# Patient Record
Sex: Female | Born: 1950 | Race: White | Hispanic: No | Smoking: Former smoker
Health system: Southern US, Community
[De-identification: ages and names within clinical notes are randomized; demographics above are authoritative.]

## PROBLEM LIST (undated history)

## (undated) DIAGNOSIS — I10 Essential (primary) hypertension: Secondary | ICD-10-CM

## (undated) DIAGNOSIS — F329 Major depressive disorder, single episode, unspecified: Secondary | ICD-10-CM

## (undated) DIAGNOSIS — F32A Depression, unspecified: Secondary | ICD-10-CM

## (undated) DIAGNOSIS — E7800 Pure hypercholesterolemia, unspecified: Secondary | ICD-10-CM

## (undated) HISTORY — PX: LAMINECTOMY: SHX219

## (undated) HISTORY — PX: CHOLECYSTECTOMY: SHX55

## (undated) HISTORY — PX: EYE SURGERY: SHX253

## (undated) HISTORY — PX: TUBAL LIGATION: SHX77

## (undated) HISTORY — PX: BRAIN SURGERY: SHX531

## (undated) HISTORY — PX: CRANIOTOMY: SHX93

## (undated) HISTORY — PX: BREAST SURGERY: SHX581

---

## 2006-07-21 ENCOUNTER — Other Ambulatory Visit: Admission: RE | Admit: 2006-07-21 | Discharge: 2006-07-21 | Payer: Self-pay | Admitting: Family Medicine

## 2008-07-08 ENCOUNTER — Observation Stay (HOSPITAL_COMMUNITY): Admission: EM | Admit: 2008-07-08 | Discharge: 2008-07-09 | Payer: Self-pay | Admitting: Emergency Medicine

## 2008-09-24 ENCOUNTER — Emergency Department (HOSPITAL_COMMUNITY): Admission: EM | Admit: 2008-09-24 | Discharge: 2008-09-24 | Payer: Self-pay | Admitting: Emergency Medicine

## 2008-09-29 ENCOUNTER — Emergency Department: Payer: Self-pay | Admitting: Emergency Medicine

## 2009-02-24 ENCOUNTER — Other Ambulatory Visit: Admission: RE | Admit: 2009-02-24 | Discharge: 2009-02-24 | Payer: Self-pay | Admitting: Family Medicine

## 2009-04-14 ENCOUNTER — Ambulatory Visit: Payer: Self-pay | Admitting: Family Medicine

## 2009-08-28 ENCOUNTER — Ambulatory Visit: Payer: Self-pay | Admitting: Family Medicine

## 2009-09-18 ENCOUNTER — Ambulatory Visit: Payer: Self-pay | Admitting: Family Medicine

## 2009-10-12 ENCOUNTER — Ambulatory Visit: Payer: Self-pay | Admitting: Family Medicine

## 2009-11-12 ENCOUNTER — Ambulatory Visit: Payer: Self-pay | Admitting: Family Medicine

## 2010-02-16 ENCOUNTER — Other Ambulatory Visit: Admission: RE | Admit: 2010-02-16 | Discharge: 2010-02-16 | Payer: Self-pay | Admitting: Family Medicine

## 2010-02-16 ENCOUNTER — Ambulatory Visit: Payer: Self-pay | Admitting: Family Medicine

## 2010-09-07 LAB — CARDIAC PANEL(CRET KIN+CKTOT+MB+TROPI): CK, MB: 1.2 ng/mL (ref 0.3–4.0)

## 2010-09-07 LAB — TROPONIN I: Troponin I: <0.01 ng/mL (ref 0.00–0.06)

## 2010-09-07 LAB — URINE CULTURE
Colony Count: NO GROWTH
Culture: NO GROWTH

## 2010-09-07 LAB — POCT I-STAT, CHEM 8
HCT: 42 % (ref 36.0–46.0)
Hemoglobin: 14.3 g/dL (ref 12.0–15.0)
Potassium: 4.5 mEq/L (ref 3.5–5.1)
Sodium: 142 mEq/L (ref 135–145)
TCO2: 26 mmol/L (ref 0–100)

## 2010-09-07 LAB — URINALYSIS, ROUTINE W REFLEX MICROSCOPIC
Bilirubin Urine: NEGATIVE
Glucose, UA: NEGATIVE mg/dL
Hgb urine dipstick: NEGATIVE
Ketones, ur: NEGATIVE mg/dL
Protein, ur: NEGATIVE mg/dL

## 2010-09-07 LAB — CK TOTAL AND CKMB (NOT AT ARMC)
Relative Index: INVALID (ref 0.0–2.5)
Total CK: 79 U/L (ref 7–177)

## 2010-09-07 LAB — URINE MICROSCOPIC-ADD ON

## 2010-09-07 LAB — POCT CARDIAC MARKERS
CKMB, poc: <1 ng/mL — ABNORMAL LOW (ref 1.0–8.0)
Myoglobin, poc: 69.2 ng/mL (ref 12–200)

## 2010-10-05 NOTE — H&P (Signed)
NAME:  Toni Reyes, Toni Reyes NO.:  [id number]   MEDICAL RECORD NO.:  [id number]          PATIENT TYPE:  EMS   LOCATION:  MAJO                         FACILITY:  [facility]   PHYSICIAN:  Hollice Espy, M.D.DATE OF BIRTH:  [birth year]   DATE OF ADMISSION:  07/08/2008  DATE OF DISCHARGE:                              HISTORY & PHYSICAL   PRIMARY CARE PHYSICIAN:  Sigmund Hazel, M.D.   CONSULTANTS ON THE CASE:  Armanda Magic, M.D., Cardiology   CHIEF COMPLAINTS:  Chest pain.   HISTORY OF PRESENT ILLNESS:  The patient is a 60 year old white female  with past medical history of hyperlipidemia and depression who for the  last several weeks has been having intermittent chest pain described as  a sharp heaviness and midsternal with no associated radiation but some  associated shortness of breath.  She initially saw her PCP and was  scheduled to have an outpatient stress test done by [facility]  next week.  However, the interim, she started having more recurrence of  chest pain and came into the emergency room for further evaluation.  In  the emergency room she had an EKG done which actually showed just a  normal sinus rhythm and a borderline prolonged QT interval of 388 but  her chest x-ray and other lab work was unremarkable.  Given her  continued symptoms, it was felt best that she come in for further  evaluation and possible inpatient stress test.  The patient received  aspirin and nitroglycerin and her chest pain has resolved.  Currently,  she is doing well.  She denies any headaches, vision changes or  dysphagia.  No current chest pain.  No palpitations.  No shortness of  breath, wheeze, cough.  No abdominal pain.  No hematuria, dysuria,  constipation, diarrhea, focal extremity numbness, weakness or pain.   REVIEW OF SYSTEMS:  Otherwise negative.   PAST MEDICAL HISTORY:  Includes depression, anxiety and hyperlipidemia.   MEDICATIONS:  The patient is on Cymbalta 90,  Zocor 60 and aspirin 81.  She also takes p.r.n. Ativan 0.25 p.o. daily for anxiety.   ALLERGIES:  No known drug allergies.   SOCIAL HISTORY:  No tobacco, alcohol or drug use.   FAMILY HISTORY:  Noted for a dad with CAD.   PHYSICAL EXAMINATION:  VITALS:  On admission, temperature 96.9, heart  rate 100, now down to 80, blood pressure 136/85, now down to 120/77,  respirations 18, O2 saturations 99% on room air.  GENERAL:  She is alert  and oriented x3.  No apparent distress.  HEENT:  Normocephalic atraumatic.  Mucous membranes are moist.  She has  no carotid bruits.  HEART:  Regular rate and rhythm, S1, S2.  Soft, subtle 2/6 systolic  ejection murmur.  LUNGS:  Clear to auscultation bilaterally.  ABDOMEN:  Soft, nontender, nondistended.  Positive bowel sounds.  EXTREMITIES:  Show no clubbing, cyanosis or edema.   LAB WORK:  Sodium 142, potassium 4.5, chloride 108, BUN is 19,  creatinine 0.9, glucose 105.  UA notes large leukocyte esterase with 11  to 20 white  cells and many bacteria.  CPK MB is 69.2, MB less than 1,  troponin I less than 0.05.   ASSESSMENT/PLAN:  Chest pain, atypical.  We will plan to check 3 sets of  enzymes.  I have discussed with cardiology and we will see about the  possibility of getting inpatient stress test.  We will defer of course  to Dr. Mayford Knife.  1. Urinary tract infection.  Treat with p.o. Cipro.  2. Depression.  Continue Cymbalta.  3. Hyperlipidemia.  The patient has recently had lab work checked.  We      will discontinue Zocor.      Hollice Espy, M.D.  Electronically Signed     SKK/MEDQ  D:  07/08/2008  T:  07/08/2008  Job:  [id number]   cc:   Armanda Magic, M.D.  Sigmund Hazel, M.D.

## 2010-10-05 NOTE — Discharge Summary (Signed)
NAME:  Toni Reyes, Toni Reyes NO.:  [id number]   MEDICAL RECORD NO.:  [id number]          PATIENT TYPE:  INP   LOCATION:  3705                         FACILITY:  [facility]   PHYSICIAN:  Ramiro Harvest, MD    DATE OF BIRTH:  03/16/1951   DATE OF ADMISSION:  07/08/2008  DATE OF DISCHARGE:  07/09/2008                               DISCHARGE SUMMARY   The patient's primary care physician is Dr. Sigmund Hazel of [facility].   DISCHARGE DIAGNOSES:  1. Chest pain.  2. Urinary tract infection.  3. Depression/anxiety.  4. Hyperlipidemia.   DISCHARGE MEDICATIONS:  1. Ciprofloxacin 500 mg p.o. b.i.d. x2 days.  2. Protonix 40 mg p.o. daily  3. Cymbalta 90 mg p.o. daily  4. Zocor 40 mg p.o. daily.  5. Aspirin 81 mg p.o. daily.  6. Ativan 0.25 mg daily as needed.   DISPOSITION AND FOLLOW-UP:  The patient will be discharged home.  The  patient is to follow up with PCP in 1 week.  The patient will also need  to follow up with cardiology as previously scheduled.  If the patient  continues to have any further chest pain and is deemed per cardiology  not to be cardiac in nature, may consider a GI referral for further  workup of her GI sources.  The patient's PCP will need to follow up on  her urine cultures.   CONSULTATIONS DONE:  None.   PROCEDURES PERFORMED:  1. A Myoview stress test was performed on July 09, 2008, which      showed no evidence of myocardial reversibility, normal left      ventricular systolic function, left ventricular EF equals 84%.  2. A chest x-ray was done on July 08, 2008, that showed no active      cardiopulmonary disease.   ADMISSION HISTORY AND PHYSICAL:  Toni Reyes is a 61 year old white  female with history of hyperlipidemia, depression and anxiety who for  the last several weeks has been having intermittent chest pain described  as a sharp heaviness in the midsternal with no associated radiation but  with some associated shortness of  breath.  The patient initially saw her  PCP and was scheduled to have an outpatient stress test done by [facility]  cardiology next week.  However, in the interim, the patient was having  more recurrence of the chest pain and presented in the ED for further  evaluation.  In the ED, the patient had a EKG done which actually showed  normal sinus rhythm, borderline prolonged QT interval of 388.  Chest x-  ray another lab work was unremarkable.  Given her continued symptoms, it  was felt best that she come in for further evaluation and possible  inpatient stress test.  The patient received aspirin and nitro in the ED  when the chest pain had resolved and was doing fine.   The patient denied any headache.  No visual changes, no dysphagia, no  current chest pain, no palpitations, no shortness of breath, no wheeze,  no cough, no abdominal pain, no hematuria, no dysuria,  no constipation,  no diarrhea; no focal extremity weakness, numbness or pain.   PHYSICAL EXAMINATION:  Per admitting physician, temperature of 96.9,  pulse of 100 down to 80, blood pressure 136/85 down to 120/77,  respiratory rate 18, saturating 99% on room air.  GENERAL:  The patient was alert and oriented x3 in no apparent distress.  HEENT:  Normocephalic, atraumatic.  Pupils equal, round and reactive to  light.  Moist mucous membranes.  No carotid bruits.  RESPIRATORY:  Lungs were clear to auscultation bilaterally.  CARDIOVASCULAR:  Regular rate and rhythm.  S1-S2, soft, subtle, 2/6  systolic ejection murmur.  ABDOMEN:  Was soft, nontender, nondistended, positive bowel sounds.  EXTREMITIES:  No clubbing, cyanosis or edema.   ADMISSION LABORATORY DATA:  Sodium 142, potassium 4.5, chloride 108, BUN  19, creatinine 0.9, glucose of 105.  UA showed a large leukocyte  esterase with 11-20 white cells, many bacteria.  CPK-MB 69.2, MB less  than 1, troponin-I less than 0.05.   HOSPITAL COURSE:  1. Chest pain.  The patient was brought  in for chest pain rule out.      Cardiac enzymes were cycled which came back negative x3.  EKG as      stated above.  A Myoview stress test was ordered.  The patient had      a Myoview stress test done on July 09, 2008, which was      essentially negative.  The patient remained asymptomatic and chest      pain free throughout the rest of the hospitalization, and the      patient will be discharged home in stable condition.  It was      discussed with the patient's cardiologist, Dr. Armanda Magic, who      felt that as this was negative the patient was safe for discharge      and the patient to follow up at her regularly scheduled appointment      with her cardiologist.  The patient will be discharged in stable      and improved condition.  2. Urinary tract infection.  The patient was noted on urinalysis to      have a UTI.  A urine culture was ordered which was pending at the      time of discharge.  The patient was placed on oral ciprofloxacin.      The patient will be discharged home on 2 more days of ciprofloxacin      500 mg b.i.d.  The patient's urine cultures will need to be      followed up per PCP.   The rest of the patient's chronic medical issues were stable throughout  the hospitalization, and the patient will be discharged in stable  condition.   On day of discharge, vital signs - temperature 98.7, pulse of 70, blood  pressure 127/77, respiratory rate 18, saturating 99% on room air.   Toni Reyes will be discharged in stable and improved condition.  It  was a pleasure taking care of Toni Reyes.      Ramiro Harvest, MD  Electronically Signed     DT/MEDQ  D:  07/09/2008  T:  07/09/2008  Job:  [id number]   cc:   Sigmund Hazel, M.D.  Armanda Magic, M.D.

## 2011-09-20 ENCOUNTER — Ambulatory Visit: Payer: Self-pay | Admitting: Family

## 2011-10-20 ENCOUNTER — Ambulatory Visit: Payer: Self-pay | Admitting: Emergency Medicine

## 2011-10-20 LAB — HEPATIC FUNCTION PANEL A (ARMC)
Alkaline Phosphatase: 72 U/L (ref 50–136)
Bilirubin, Direct: <0.1 mg/dL (ref 0.00–0.20)
Bilirubin,Total: 0.3 mg/dL (ref 0.2–1.0)
SGPT (ALT): 40 U/L

## 2011-11-18 ENCOUNTER — Ambulatory Visit: Payer: Self-pay | Admitting: Emergency Medicine

## 2011-11-22 LAB — PATHOLOGY REPORT

## 2011-12-31 ENCOUNTER — Emergency Department (HOSPITAL_COMMUNITY)
Admission: EM | Admit: 2011-12-31 | Discharge: 2011-12-31 | Disposition: A | Discharge status: Home / Self Care | Payer: BLUE CROSS/BLUE SHIELD | Source: Home / Self Care | Attending: Family Medicine | Admitting: Family Medicine

## 2011-12-31 ENCOUNTER — Encounter (HOSPITAL_COMMUNITY): Payer: Self-pay | Admitting: *Deleted

## 2011-12-31 DIAGNOSIS — N390 Urinary tract infection, site not specified: Secondary | ICD-10-CM

## 2011-12-31 HISTORY — DX: Depression, unspecified: F32.A

## 2011-12-31 HISTORY — DX: Pure hypercholesterolemia, unspecified: E78.00

## 2011-12-31 HISTORY — DX: Essential (primary) hypertension: I10

## 2011-12-31 HISTORY — DX: Major depressive disorder, single episode, unspecified: F32.9

## 2011-12-31 LAB — POCT URINALYSIS DIP (DEVICE)
Bilirubin Urine: NEGATIVE
Nitrite: NEGATIVE
Protein, ur: >=300 mg/dL — AB
pH: 7 (ref 5.0–8.0)

## 2011-12-31 MED ORDER — CEPHALEXIN 500 MG PO CAPS: 500.0000 mg | ORAL_CAPSULE | Freq: Four times a day (QID) | ORAL | Status: AC

## 2011-12-31 NOTE — ED Notes (Signed)
Pt with onset of UTI symptoms x 3 days  Frequency/hematuria/dysuria/urgency - hx: UTI

## 2011-12-31 NOTE — ED Provider Notes (Signed)
History     CSN: [id number]  Arrival date & time 12/31/11  1130   None     Chief Complaint  Patient presents with  . Urinary Tract Infection  . Urinary Frequency  . Dysuria  . Hematuria    (Consider location/radiation/quality/duration/timing/severity/associated sxs/prior treatment) HPI Comments: Burning frequency for 3 days, blood today in urine. Has incontinence, occasionally gets utis if isn't able to maintain usual cleanliness. Last uti 1 year ago. No abx in last 6 months.   Patient is a 61 y.o. female presenting with urinary tract infection. The history is provided by the patient.  Urinary Tract Infection This is a new problem. Episode onset: 3 days ago. Episode frequency: with every urination. The problem has been gradually worsening. Pertinent negatives include no abdominal pain. Exacerbated by: urinating. Nothing relieves the symptoms. Treatments tried: otc pyridium.    Past Medical History  Diagnosis Date  . High cholesterol   . Depression   . Hypertension     Past Surgical History  Procedure Date  . Craniotomy   . Cesarean section   . Tubal ligation   . Laminectomy     History reviewed. No pertinent family history.  History  Substance Use Topics  . Smoking status: Never Smoker   . Smokeless tobacco: Not on file  . Alcohol Use: No    OB History    Grav Para Term Preterm Abortions TAB SAB Ect Mult Living                  Review of Systems  Constitutional: Negative for fever and chills.  Gastrointestinal: Negative for nausea, vomiting and abdominal pain.  Genitourinary: Positive for dysuria and frequency. Negative for flank pain, vaginal bleeding and vaginal discharge.    Allergies  Review of patient's allergies indicates no known allergies.  Home Medications   Current Outpatient Rx  Name Route Sig Dispense Refill  . FENOFIBRATE 150 MG PO CAPS Oral Take by mouth.    Marland Kitchen PRAVASTATIN SODIUM PO Oral Take by mouth.    . VENLAFAXINE HCL ER 150 MG  PO CP24 Oral Take 150 mg by mouth daily.    . CEPHALEXIN 500 MG PO CAPS Oral Take 1 capsule (500 mg total) by mouth 4 (four) times daily. 20 capsule 0    BP 140/67  Pulse 98  Temp 98.7 F (37.1 C) (Oral)  Resp 20  SpO2 100%  Physical Exam  Constitutional: She appears well-developed and well-nourished. No distress.  Cardiovascular: Normal rate and regular rhythm.   Pulmonary/Chest: Effort normal and breath sounds normal.  Abdominal: Soft. Normal appearance and bowel sounds are normal. She exhibits no distension. There is no tenderness. There is no rigidity, no rebound, no guarding and no CVA tenderness.    ED Course  Procedures (including critical care time)  Labs Reviewed  POCT URINALYSIS DIP (DEVICE) - Abnormal; Notable for the following:    Hgb urine dipstick LARGE (*)     Protein, ur >=300 (*)     Leukocytes, UA SMALL (*)  Biochemical Testing Only. Please order routine urinalysis from main lab if confirmatory testing is needed.   All other components within normal limits   No results found.   1. UTI (lower urinary tract infection)       MDM          Cathlyn Parsons, NP 12/31/11 1337

## 2012-01-01 NOTE — ED Provider Notes (Signed)
Medical screening examination/treatment/procedure(s) were performed by non-physician practitioner and as supervising physician I was immediately available for consultation/collaboration.   MORENO-COLL,Jermine Bibbee; MD   Kaylie Ritter Moreno-Coll, MD 01/01/12 0744 

## 2012-02-09 DIAGNOSIS — R32 Unspecified urinary incontinence: Secondary | ICD-10-CM | POA: Insufficient documentation

## 2012-10-13 IMAGING — US ABDOMEN ULTRASOUND
1 series · 17 of 25 positions shown · non-contrast
Comparison: none

REASON FOR EXAM: RUQ pain
COMMENTS:

[Series 1: abdomen ultrasound · 17 of 76 slices shown]
[im 1/76]
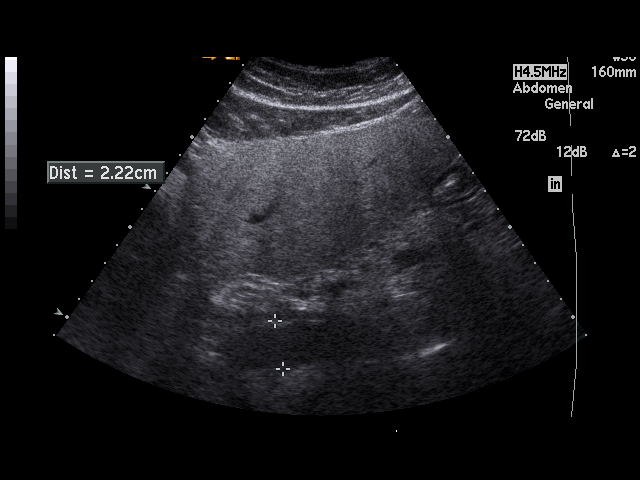
[im 7/76]
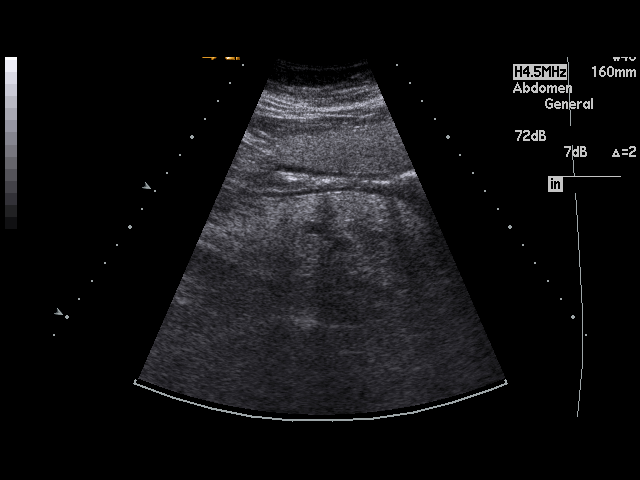
[im 10/76]
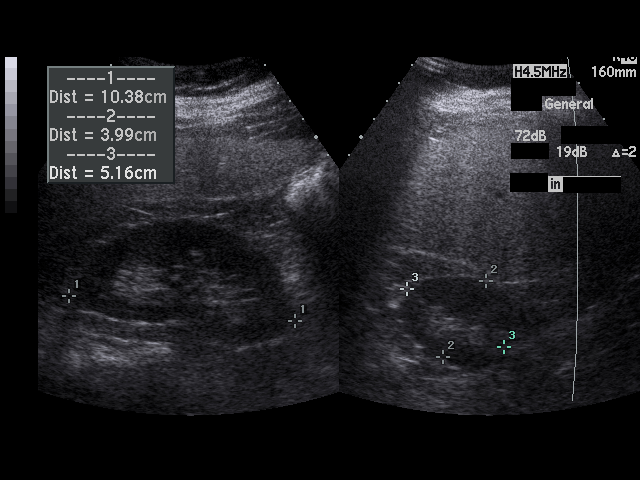
[im 16/76]
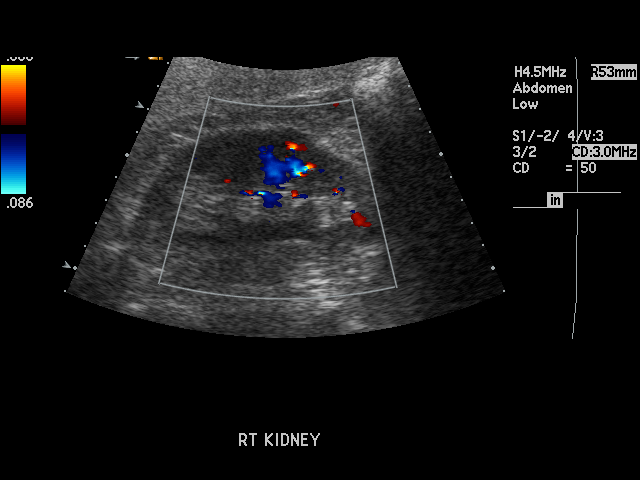
[im 19/76]
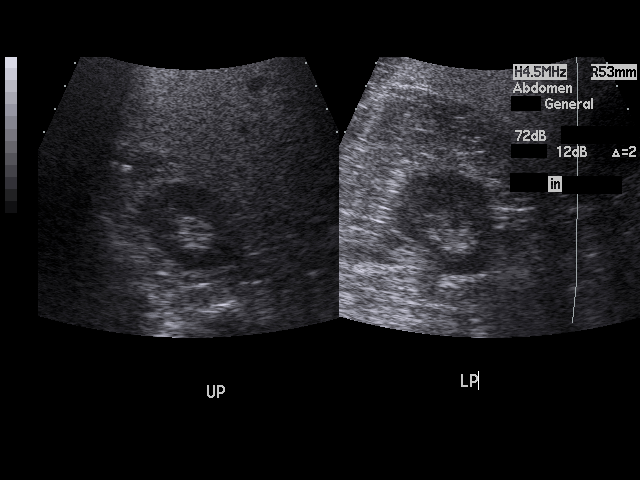
[im 26/76]
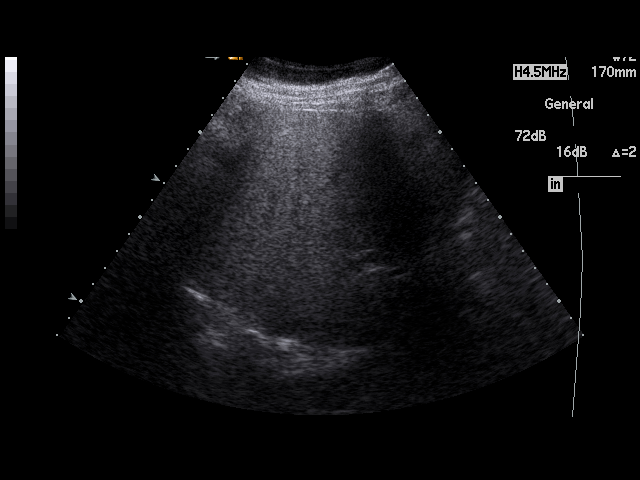
[im 29/76]
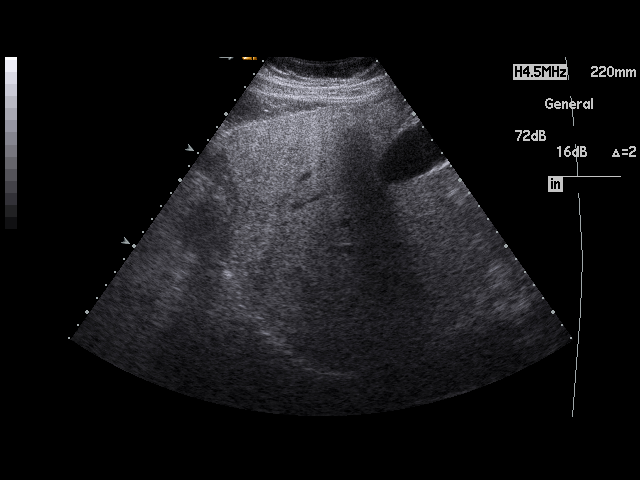
[im 35/76]
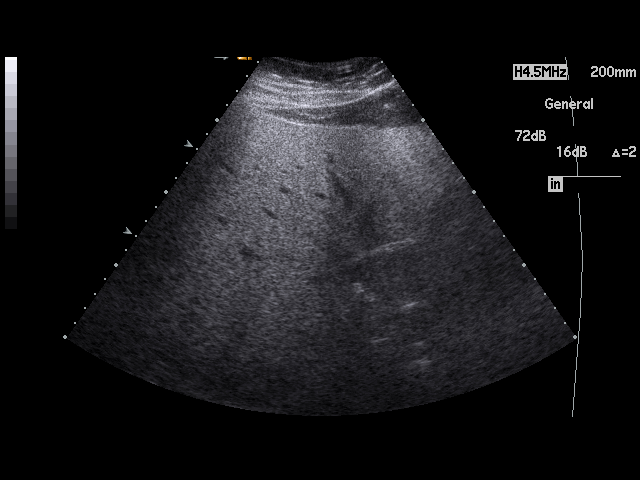
[im 38/76]
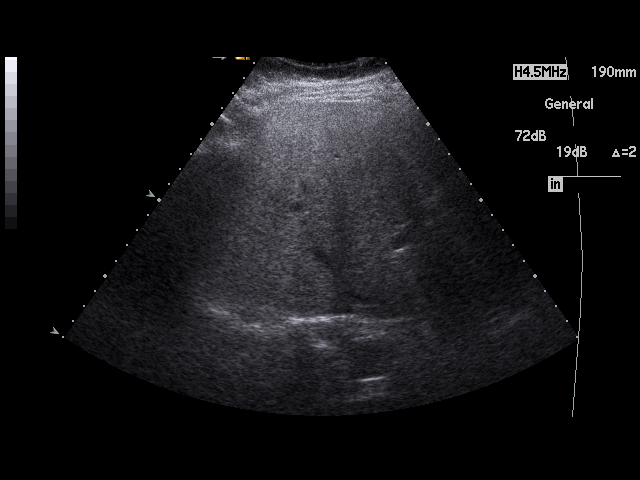
[im 41/76]
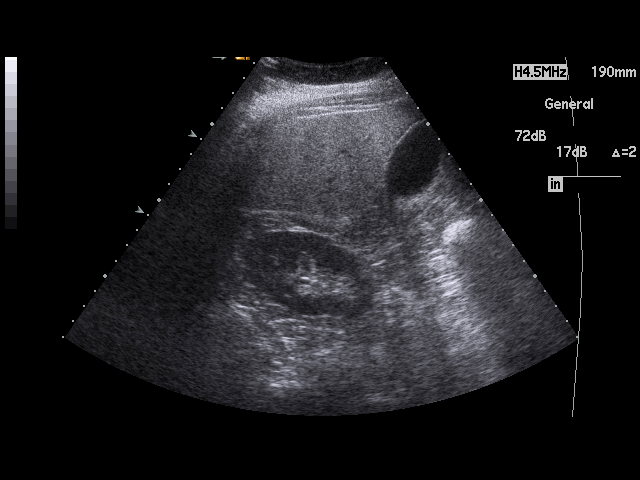
[im 47/76]
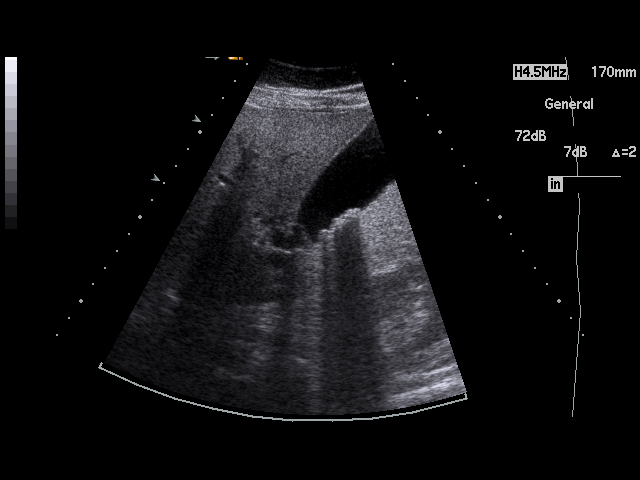
[im 51/76]
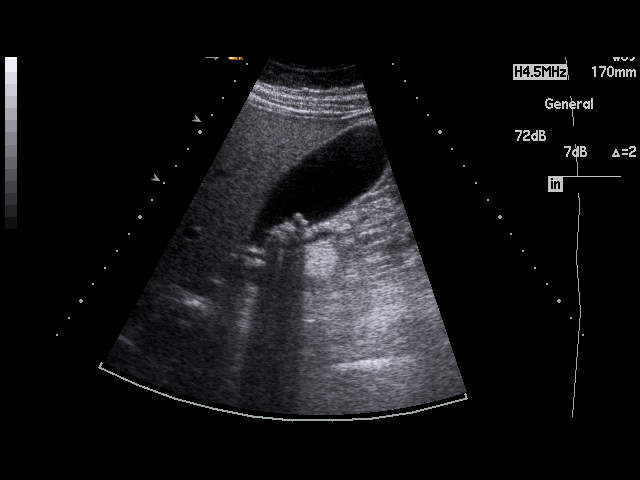
[im 57/76]
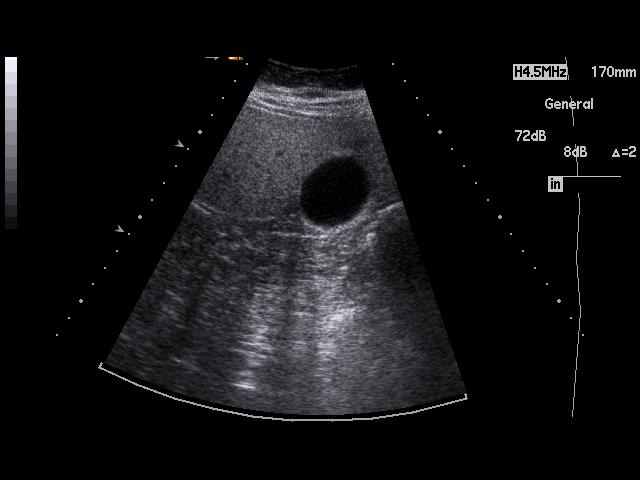
[im 60/76]
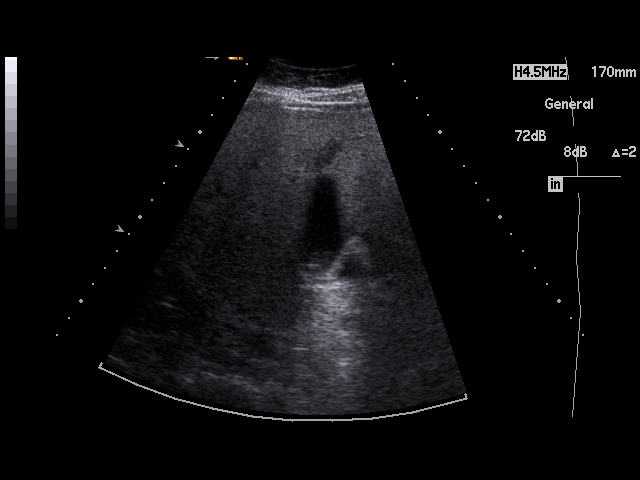
[im 66/76]
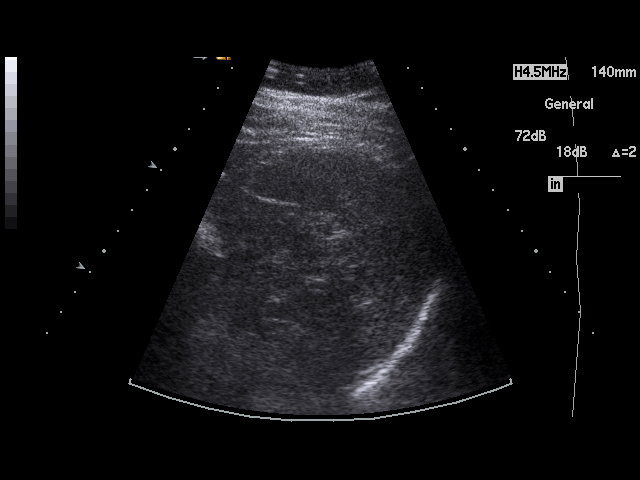
[im 69/76]
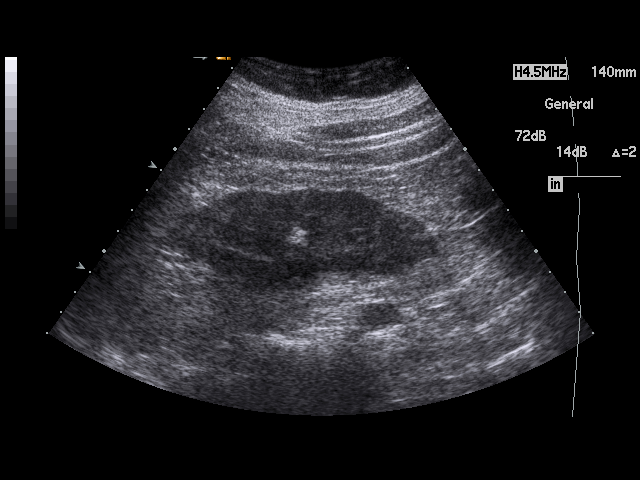
[im 76/76]
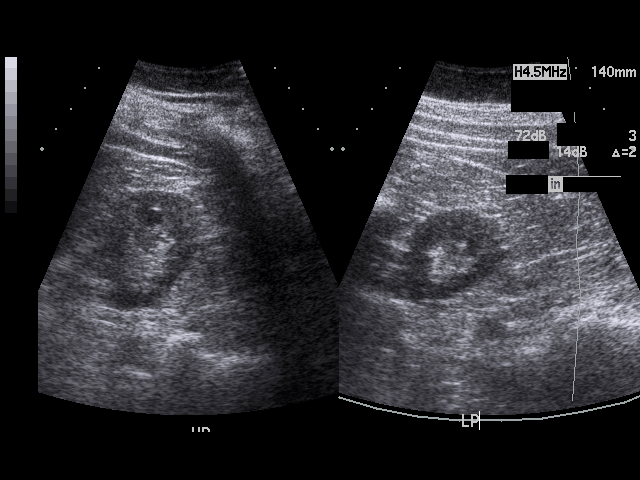

[17 of 25 positions shown; findings below may reference images not displayed]

PROCEDURE:     US  - US ABDOMEN GENERAL SURVEY  - September 20, 2011  [DATE]

RESULT:     The liver is hyperechogenic, suspicious for fatty infiltration.
No focal hepatic mass is seen. Spleen size is normal. The pancreas,
abdominal aorta and inferior vena cava show no significant abnormalities.
There are multiple mobile echodensities within the gallbladder compatible
with gallstones. There is no thickening of the gallbladder wall. No
pericholecystic fluid is seen. There is no thickening of the gallbladder
wall which measures 2.4 mm at maximum thickness. The common bile duct
measures variably between 4.7 cm and 7.3 cm. There are no dilated
intrahepatic bile ducts identified to suggest common duct obstruction. The
kidneys show no hydronephrosis. Sagittally, the right kidney measures
cm and left measures 11.93 cm. No ascites is seen.
IMPRESSION: 1. Probable fatty infiltration of the liver.
2. Cholelithiasis.

## 2012-11-20 ENCOUNTER — Ambulatory Visit (INDEPENDENT_AMBULATORY_CARE_PROVIDER_SITE_OTHER): Payer: BC Managed Care – PPO | Admitting: Physician Assistant

## 2012-11-20 ENCOUNTER — Telehealth: Payer: Self-pay | Admitting: Radiology

## 2012-11-20 VITALS — BP 132/74 | HR 81 | Temp 98.0°F | Resp 16 | Ht 61.0 in | Wt 168.0 lb

## 2012-11-20 DIAGNOSIS — F32A Depression, unspecified: Secondary | ICD-10-CM

## 2012-11-20 DIAGNOSIS — F329 Major depressive disorder, single episode, unspecified: Secondary | ICD-10-CM

## 2012-11-20 DIAGNOSIS — F3289 Other specified depressive episodes: Secondary | ICD-10-CM

## 2012-11-20 MED ORDER — BUPROPION HCL ER (SR) 150 MG PO TB12: 150.0000 mg | ORAL_TABLET | Freq: Two times a day (BID) | ORAL | Status: DC

## 2012-11-20 MED ORDER — BUPROPION HCL ER (XL) 150 MG PO TB24: 150.0000 mg | ORAL_TABLET | Freq: Every day | ORAL | Status: DC

## 2012-11-20 MED ORDER — VENLAFAXINE HCL ER 150 MG PO CP24: 150.0000 mg | ORAL_CAPSULE | Freq: Every day | ORAL | Status: DC

## 2012-11-20 NOTE — Progress Notes (Signed)
  Subjective:    Patient ID: Toni Reyes, female    DOB: 1950-09-08, 62 y.o.   MRN: [id number]  HPI 62 year old female presents for refills of Effexor-XR and Wellbutrin-SR.  She takes both daily and has been stable on these medications for years.  Has history of treatment by a psychiatrist but has only seen a psychologist for the past few years.  Is interested in being referred to psychiatry again. She does feel like she has fairly good control of her depression and stress with her medications, she does think that it could be better.  Still feels angry towards people and tends to withdraw.  Denies SI/HI. She has been out of her medications for 6 days and definitely feels "more stressed, angry, and sad."    PMHx of hypertension and hyperlipidemia. Continues to take lisinopril daily which has done well. Has not been taking fenofibrate or pravastatin due to cost.   Patient is otherwise doing well.     Review of Systems  Constitutional: Negative for fever and chills.  Gastrointestinal: Negative for nausea and vomiting.  Psychiatric/Behavioral: Negative for suicidal ideas, behavioral problems and agitation.       Objective:   Physical Exam  Constitutional: She is oriented to person, place, and time. She appears well-developed and well-nourished.  HENT:  Head: Normocephalic and atraumatic.  Right Ear: External ear normal.  Left Ear: External ear normal.  Eyes: Conjunctivae are normal.  Neck: Normal range of motion.  Cardiovascular: Normal rate.   Pulmonary/Chest: Effort normal.  Neurological: She is alert and oriented to person, place, and time.  Psychiatric: She has a normal mood and affect. Her behavior is normal. Judgment and thought content normal. She expresses no homicidal and no suicidal ideation.  Tearful during exam          Assessment & Plan:  Depression - Plan: buPROPion (WELLBUTRIN SR) 150 MG 12 hr tablet, venlafaxine XR (EFFEXOR-XR) 150 MG 24 hr capsule, Ambulatory  referral to Psychiatry Refill Effexor-XR 150 mg daily and Wellbutrin 150 mg-SR daily Referral placed for psychiatry Recommend schedule an appointment at 104 for CPE - prefers MD.  Follow up here as needed.

## 2012-11-20 NOTE — Telephone Encounter (Signed)
Sent to pharmacy 

## 2012-11-20 NOTE — Telephone Encounter (Signed)
Please verify which Wellbutrin patient should be on pharmacy indicates she was previously on XL please advise. Need to resubmit.

## 2013-06-25 ENCOUNTER — Ambulatory Visit: Payer: Self-pay | Admitting: Family Medicine

## 2013-06-25 VITALS — BP 175/92 | HR 83 | Temp 97.8°F | Resp 18 | Wt 175.0 lb

## 2013-06-25 DIAGNOSIS — J019 Acute sinusitis, unspecified: Secondary | ICD-10-CM

## 2013-06-25 DIAGNOSIS — F329 Major depressive disorder, single episode, unspecified: Secondary | ICD-10-CM

## 2013-06-25 DIAGNOSIS — J209 Acute bronchitis, unspecified: Secondary | ICD-10-CM

## 2013-06-25 DIAGNOSIS — F3289 Other specified depressive episodes: Secondary | ICD-10-CM

## 2013-06-25 DIAGNOSIS — F32A Depression, unspecified: Secondary | ICD-10-CM

## 2013-06-25 MED ORDER — AMOXICILLIN 875 MG PO TABS: 875.0000 mg | ORAL_TABLET | Freq: Two times a day (BID) | ORAL | Status: DC

## 2013-06-25 MED ORDER — BUPROPION HCL ER (XL) 150 MG PO TB24: 150.0000 mg | ORAL_TABLET | Freq: Every day | ORAL | Status: DC

## 2013-06-25 MED ORDER — VENLAFAXINE HCL ER 150 MG PO CP24: 150.0000 mg | ORAL_CAPSULE | Freq: Every day | ORAL | Status: DC

## 2013-06-25 NOTE — Patient Instructions (Signed)
Drink plenty of fluids. Actually that is your best expectorant to keep himself well hydrated.  Take Mucinex or Robitussin, plain for the mucus thinning affect.  Amoxicillin 875 one twice daily  Return if worse or not improving, at which time we would need to do further studies.

## 2013-06-25 NOTE — Progress Notes (Signed)
Subjective: Patient has had a respiratory tract infection a couple weeks ago and her sinuses been staying congested. She has developed a cough also with it, and continues to cough and is short of breath on exertion. No fevers. Not bringing up much mucus. This doesn't feel like he has any energy.  Objective: Pleasant lady in no major distress. TMs normal. Right maxillary sinuses tender, other. Her throat was clear. Neck supple without nodes. Chest is clear to auscultation. Heart regular without murmurs.  Assessment: Sinusitis/bronchitis  Plan: Patient does not have insurance. Went ahead and treat with amoxicillin 875 twice a day and OTC mucolytic. If she gets worse or is not improving we'll then need to do further studies, but decided not to at this time.

## 2013-12-10 DIAGNOSIS — F329 Major depressive disorder, single episode, unspecified: Secondary | ICD-10-CM | POA: Diagnosis not present

## 2013-12-10 DIAGNOSIS — I10 Essential (primary) hypertension: Secondary | ICD-10-CM | POA: Diagnosis not present

## 2013-12-10 DIAGNOSIS — M899 Disorder of bone, unspecified: Secondary | ICD-10-CM | POA: Diagnosis not present

## 2013-12-10 DIAGNOSIS — Z0000 Encounter for general adult medical examination without abnormal findings: Secondary | ICD-10-CM | POA: Diagnosis not present

## 2013-12-10 DIAGNOSIS — R635 Abnormal weight gain: Secondary | ICD-10-CM | POA: Diagnosis not present

## 2013-12-10 DIAGNOSIS — M949 Disorder of cartilage, unspecified: Secondary | ICD-10-CM | POA: Diagnosis not present

## 2013-12-10 DIAGNOSIS — E785 Hyperlipidemia, unspecified: Secondary | ICD-10-CM | POA: Diagnosis not present

## 2013-12-10 DIAGNOSIS — Z23 Encounter for immunization: Secondary | ICD-10-CM | POA: Diagnosis not present

## 2013-12-10 DIAGNOSIS — E781 Pure hyperglyceridemia: Secondary | ICD-10-CM | POA: Diagnosis not present

## 2013-12-10 DIAGNOSIS — E669 Obesity, unspecified: Secondary | ICD-10-CM | POA: Diagnosis not present

## 2013-12-12 DIAGNOSIS — M8580 Other specified disorders of bone density and structure, unspecified site: Secondary | ICD-10-CM | POA: Insufficient documentation

## 2013-12-17 DIAGNOSIS — Z1231 Encounter for screening mammogram for malignant neoplasm of breast: Secondary | ICD-10-CM | POA: Diagnosis not present

## 2013-12-17 DIAGNOSIS — Z780 Asymptomatic menopausal state: Secondary | ICD-10-CM | POA: Diagnosis not present

## 2013-12-17 DIAGNOSIS — Z8262 Family history of osteoporosis: Secondary | ICD-10-CM | POA: Diagnosis not present

## 2014-03-25 DIAGNOSIS — F322 Major depressive disorder, single episode, severe without psychotic features: Secondary | ICD-10-CM | POA: Diagnosis not present

## 2014-04-07 DIAGNOSIS — F322 Major depressive disorder, single episode, severe without psychotic features: Secondary | ICD-10-CM | POA: Diagnosis not present

## 2014-04-07 DIAGNOSIS — E781 Pure hyperglyceridemia: Secondary | ICD-10-CM | POA: Diagnosis not present

## 2014-04-07 DIAGNOSIS — Z23 Encounter for immunization: Secondary | ICD-10-CM | POA: Diagnosis not present

## 2014-04-07 DIAGNOSIS — I10 Essential (primary) hypertension: Secondary | ICD-10-CM | POA: Diagnosis not present

## 2014-07-04 DIAGNOSIS — H2513 Age-related nuclear cataract, bilateral: Secondary | ICD-10-CM | POA: Diagnosis not present

## 2014-07-04 DIAGNOSIS — H40033 Anatomical narrow angle, bilateral: Secondary | ICD-10-CM | POA: Diagnosis not present

## 2014-07-22 DIAGNOSIS — H25811 Combined forms of age-related cataract, right eye: Secondary | ICD-10-CM | POA: Diagnosis not present

## 2014-09-14 NOTE — Op Note (Signed)
PATIENT NAME:  Toni Reyes, Toni Reyes MR#:  [id number] DATE OF BIRTH:  1950-06-13  DATE OF PROCEDURE:  11/18/2011  PREOPERATIVE DIAGNOSIS: Acute cholecystis.   POSTOPERATIVE DIAGNOSIS: Acute cholecystitis.   PROCEDURE PERFORMED: Laparoscopic cholecystectomy with attempted cholangiogram.   SURGEON: Tomaz Janis S. Margaruite Top, MD  INDICATIONS: This patient was seen by me in my office because of right upper quadrant abdominal pain. Her liver function was normal. She has been having off and on acute attacks of cholelithiasis.   DESCRIPTION OF PROCEDURE: Patient was then brought to surgery. Under general anesthesia, the abdomen was then prepped and draped. Small incision was made above the umbilicus. After cutting skin and subcutaneous tissue, fascia was cut. The abdomen was then entered under direct vision. Hassan trocar was inserted. Another trocar inserted under direct vision on the right upper quadrant. Two 5 mm put in the right upper quadrant of the abdomen. In the beginning when we saw the gallbladder, it was stuck with adhesions. The gallbladder was then lifted up. Adhesions of the omentum were then lysed all the way down to the cystic duct gallbladder junction. That area at the [facility], gallbladder was grasped. Dissection was done and cystic artery was then found and it was clipped and cut. After that patient was found to have lots of stones in the [facility] pouch going towards the cystic duct area. It was milked up and I tried to do a cholangiogram but it did not go into the common duct. It kept on going towards the gallbladder because the cystic duct had lots of stones. I stopped the procedure. After that I dissected out the cystic duct very nicely, lifted everything up and then put clip near the gallbladder cystic duct junction and three clips applied distally and one proximally and then cystic duct was then cut. After that gallbladder was divided and cystic artery was divided and gallbladder lifted up from the  liver bed very nicely and had a lot of edema in the gallbladder due to acute inflammation. Gallbladder was then removal of the liver bed. After that, irrigation of the abdomen was performed, made sure there was no biliary leak and there was no bleeding. A small area of bleeding from the liver was then coagulated and after that all the trocar sites were inspected for any bleeding. There was not and then all the trocars were removed. Gallbladder then removed from the epigastric port. Patient was found to have lots of multiple small stones. Gallbladder was then inspected outside of body and I could see very nicely whole gallbladder plus the junction of the cystic duct very nicely on it. And after this was done, the umbilical area was then closed with interrupted 0 Vicryl sutures. Clips were applied.to rest of the wounds and Marcaine was injected. Patient tolerated procedure well, sent to recover room in satisfactory condition.    ____________________________ Welford Roche Phylis Bougie, MD msh:cms D: 11/18/2011 14:38:33 ET T: 11/18/2011 14:46:07 ET JOB#: [id number]  cc: Ellason Segar S. Phylis Bougie, MD, <Dictator> Ginette A. Oran Rein, MD  Sharene Butters MD ELECTRONICALLY SIGNED 11/29/2011 8:39

## 2015-03-10 ENCOUNTER — Ambulatory Visit (INDEPENDENT_AMBULATORY_CARE_PROVIDER_SITE_OTHER): Payer: Medicare Other | Admitting: Family Medicine

## 2015-03-10 VITALS — BP 142/80 | HR 78 | Temp 97.9°F | Resp 18 | Ht 61.0 in | Wt 174.0 lb

## 2015-03-10 DIAGNOSIS — F329 Major depressive disorder, single episode, unspecified: Secondary | ICD-10-CM

## 2015-03-10 DIAGNOSIS — F32A Depression, unspecified: Secondary | ICD-10-CM

## 2015-03-10 MED ORDER — BUPROPION HCL ER (XL) 150 MG PO TB24: 150.0000 mg | ORAL_TABLET | Freq: Every day | ORAL | Status: DC

## 2015-03-10 MED ORDER — VENLAFAXINE HCL ER 75 MG PO CP24: ORAL_CAPSULE | ORAL | Status: DC

## 2015-03-10 NOTE — Progress Notes (Signed)
Patient ID: Toni Reyes, female    DOB: [birth year]  Age: 64 y.o. MRN: [id number]  Chief Complaint  Patient presents with  . Medication Refill    wellbutrin and effexor    Subjective:   Patient has a history of depression. She was well controlled on Effexor and Wellbutrin. However she ran out 3 days ago. The doctor she has been going to since she saw me urine half ago has gone out on maternity leave. The patient can get her medicines really filled. She is feeling a lot worse over the last 3 days. She would like to be on a little lower dose of the medicine. She thought the Wellbutrin might be causing her to feel sluggish. We discussed the doses of these medications, and I believe that Effexor is probably the one to cut.  Current allergies, medications, problem list, past/family and social histories reviewed.  Objective:  BP 142/80 mmHg  Pulse 78  Temp(Src) 97.9 F (36.6 C) (Oral)  Resp 18  Ht 5\' 1"  (1.549 m)  Wt 174 lb (78.926 kg)  BMI 32.89 kg/m2  SpO2 98%  Chest clear. Heart regular without murmurs. No acute distress.  Assessment & Plan:   Assessment: 1. Depression       Plan: Depression needs treatment still. She has episodes of crying and sadness. She is a Retail buyer. 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 She has not a church person, and not interested in that. She is willing to consider volunteering. We talked a lot about that.   Meds ordered this encounter  Medications  . buPROPion (WELLBUTRIN XL) 150 MG 24 hr tablet    Sig: Take 1 tablet (150 mg total) by mouth daily.    Dispense:  30 tablet    Refill:  5  . venlafaxine XR (EFFEXOR-XR) 75 MG 24 hr capsule    Sig: Take one daily    Dispense:  30 capsule    Refill:  5         Patient Instructions  Decrease the venlafaxine to 75 mg daily  Continue the Wellbutrin 150 daily  Try to get more regular exercise  I really encourage you to get involved outside your home in some  kind of a volunteer activity.  Return at any time if needed. Plan to return before your medications run out in 6 months.     Return in about 6 months (around 09/08/2015).   Ellyse Rotolo, MD 03/10/2015

## 2015-03-10 NOTE — Patient Instructions (Signed)
Decrease the venlafaxine to 75 mg daily  Continue the Wellbutrin 150 daily  Try to get more regular exercise  I really encourage you to get involved outside your home in some kind of a volunteer activity.  Return at any time if needed. Plan to return before your medications run out in 6 months.

## 2015-09-14 ENCOUNTER — Ambulatory Visit (INDEPENDENT_AMBULATORY_CARE_PROVIDER_SITE_OTHER): Payer: Medicare Other | Admitting: Physician Assistant

## 2015-09-14 VITALS — BP 138/82 | HR 94 | Temp 98.0°F | Resp 18 | Ht 61.0 in | Wt 182.0 lb

## 2015-09-14 DIAGNOSIS — K047 Periapical abscess without sinus: Secondary | ICD-10-CM

## 2015-09-14 DIAGNOSIS — Z1239 Encounter for other screening for malignant neoplasm of breast: Secondary | ICD-10-CM | POA: Diagnosis not present

## 2015-09-14 DIAGNOSIS — F32A Depression, unspecified: Secondary | ICD-10-CM

## 2015-09-14 DIAGNOSIS — F329 Major depressive disorder, single episode, unspecified: Secondary | ICD-10-CM | POA: Diagnosis not present

## 2015-09-14 MED ORDER — BUPROPION HCL ER (XL) 150 MG PO TB24: 150.0000 mg | ORAL_TABLET | Freq: Every day | ORAL | Status: DC

## 2015-09-14 MED ORDER — VENLAFAXINE HCL ER 150 MG PO CP24: 150.0000 mg | ORAL_CAPSULE | Freq: Every day | ORAL | Status: DC

## 2015-09-14 MED ORDER — AMOXICILLIN 875 MG PO TABS: 875.0000 mg | ORAL_TABLET | Freq: Two times a day (BID) | ORAL | Status: DC

## 2015-09-14 NOTE — Progress Notes (Signed)
[facility] [address] [place] [address] [phone]  Date:  09/14/2015   Name:  Toni Reyes   DOB:  [birth year]   MRN:  [id number]  PCP:  No PCP Per Patient    Chief Complaint: Medication Refill and Dental Pain   History of Present Illness:  This is a 65 y.o. female with PMH depression who is presenting for med refills and is also complaining of dental pain.  Dental pain x 6 weeks. States she noticed a firm lump on her left upper gum line. Wasn't having much pain to start. Over the past week has noticed more pain into her left sinus. She also has more pain if she messes with the lump with her tongue. No pain with chewing. Not sensitive to hot or cold. States about a year she saw a dentist who tried to do a root canal in this area but was not able to "because it was calcified". She states her insurance does not fully cover dental visits and she cannot afford to be seen currently.  Pt takes wellbutrin 150 mg QD and effexor-XR 75 mg QD for depression. 6 months ago Dr. Linna Darner cut back her effexor from 150 mg to 75 mg. She states she needs to go back to the 150 mg. She is more emotional since being on the lower dose. States she has more trouble with concentration. More problems with anger and irritability. Sleeping usually 4-5 hours a night. Occ will get 7 hours. Slept better on the higher dose of effexor. She has been on effexor and wellbutrin combination for about 5 years. She has been on some form of antidepressant for 40 years.  Mammogram: about 2 years ago Colonoscopy in 2012, on 10 year cycle. Declines vaccines today. She has not had a CPE in a while. She is wanting to establish care here with a provider soon. She has been here several times but has seen a new provider each. She was not happy with the provider she saw here last time. States he "was pushing God on me and I did not feel this was the place".  Review of Systems:  Review of Systems See  HPI  Patient Active Problem List   Diagnosis Date Noted  . Depression 03/10/2015    Prior to Admission medications   Medication Sig Start Date End Date Taking? Authorizing Provider  buPROPion (WELLBUTRIN XL) 150 MG 24 hr tablet Take 1 tablet (150 mg total) by mouth daily. 03/10/15  Yes Posey Boyer, MD  venlafaxine XR (EFFEXOR-XR) 75 MG 24 hr capsule Take one daily 03/10/15  Yes Posey Boyer, MD    No Known Allergies  Past Surgical History  Procedure Laterality Date  . Craniotomy    . Cesarean section    . Tubal ligation    . Laminectomy    . Breast surgery    . Cholecystectomy    . Eye surgery    . Brain surgery      Social History  Substance Use Topics  . Smoking status: Former Research scientist (life sciences)  . Smokeless tobacco: None  . Alcohol Use: Yes    Family History  Problem Relation Age of Onset  . Emphysema Mother   . Cancer Father   . Cancer Sister   . Emphysema Sister   . Cancer Maternal Grandfather   . Cancer Sister   . Emphysema Sister   . Cancer Sister   . Emphysema Sister     Medication  list has been reviewed and updated.  Physical Examination:  Physical Exam  Constitutional: She is oriented to person, place, and time. She appears well-developed and well-nourished. No distress.  HENT:  Head: Normocephalic and atraumatic.  Right Ear: Hearing, external ear and ear canal normal. A middle ear effusion is present.  Left Ear: Hearing, tympanic membrane, external ear and ear canal normal.  Nose: Nose normal.  Mouth/Throat: Uvula is midline, oropharynx is clear and moist and mucous membranes are normal.  1 cm firm nodule on upper left, in between gingiva and buccal mucosa. Small pustule. No fluctuance. No purulence expressed with application of pressure.  Eyes: Conjunctivae and lids are normal. Right eye exhibits no discharge. Left eye exhibits no discharge. No scleral icterus.  Cardiovascular: Normal rate, regular rhythm, normal heart sounds and normal pulses.   No  murmur heard. Pulmonary/Chest: Effort normal and breath sounds normal. No respiratory distress. She has no wheezes. She has no rhonchi.  Musculoskeletal: Normal range of motion.  Lymphadenopathy:       Head (right side): No submental, no submandibular and no tonsillar adenopathy present.       Head (left side): No submental, no submandibular and no tonsillar adenopathy present.    She has no cervical adenopathy.  Neurological: She is alert and oriented to person, place, and time.  Skin: Skin is warm, dry and intact. No lesion and no rash noted.  Psychiatric: She has a normal mood and affect. Her speech is normal and behavior is normal. Thought content normal.   BP 138/82 mmHg  Pulse 94  Temp(Src) 98 F (36.7 C) (Oral)  Resp 18  Ht 5\' 1"  (1.549 m)  Wt 182 lb (82.555 kg)  BMI 34.41 kg/m2  SpO2 96%  Assessment and Plan:  1. Depression Increase effexor back to 150 mg. Keep wellbutrin at same dose. Return for CPE soon. - venlafaxine XR (EFFEXOR-XR) 150 MG 24 hr capsule; Take 1 capsule (150 mg total) by mouth daily.  Dispense: 90 capsule; Refill: 3 - buPROPion (WELLBUTRIN XL) 150 MG 24 hr tablet; Take 1 tablet (150 mg total) by mouth daily.  Dispense: 90 tablet; Refill: 3  2. Dental abscess Amoxicillin BID x 10 days. I have encouraged her to find a dentist in her network who is covered better or who takes payments. - amoxicillin (AMOXIL) 875 MG tablet; Take 1 tablet (875 mg total) by mouth 2 (two) times daily.  Dispense: 20 tablet; Refill: 0  3. Breast cancer screening Gave contact information for scheduling.   Benjaman Pott Drenda Freeze, MHS [facility]  09/14/2015

## 2015-09-14 NOTE — Patient Instructions (Addendum)
Start back on 150 mg effexor with your wellbutrin dose staying the same. Return at your convenience for a complete physical. Make appt for your mammogram.  Take amoxicillin twice a day for 10 days. Do research on dentist that takes payments.  Return as needed.   We recommend that you schedule a mammogram for breast cancer screening. Typically, you do not need a referral to do this. Please contact a local imaging center to schedule your mammogram.  [facility] - [phone]  *ask for the Radiology Department [facility] ([place]) - [phone] or [phone]  [facility] - [phone] [place] [phone] [facility] - [phone]  *ask for the [facility] - [phone]  *ask for the Radiology Department [facility] - [phone]  *ask for the [facility] - [phone]     IF you received an x-ray today, you will receive an invoice from [facility]. Please contact [facility] at [phone] with questions or concerns regarding your invoice.   IF you received labwork today, you will receive an invoice from [employer]. Please contact [facility] at [phone] with questions or concerns regarding your invoice.   Our billing staff will not be able to assist you with questions regarding bills from these companies.  You will be contacted with the lab results as soon as they are available. The fastest way to get your results is to activate your My Chart account. Instructions are located on the last page of this paperwork. If you have not heard from [place] regarding the results in 2 weeks, please contact this office.

## 2016-06-22 ENCOUNTER — Ambulatory Visit (INDEPENDENT_AMBULATORY_CARE_PROVIDER_SITE_OTHER): Payer: Medicare Other | Admitting: Urgent Care

## 2016-06-22 VITALS — BP 150/86 | HR 85 | Temp 97.7°F | Resp 18 | Ht 61.0 in | Wt 176.4 lb

## 2016-06-22 DIAGNOSIS — H9201 Otalgia, right ear: Secondary | ICD-10-CM

## 2016-06-22 DIAGNOSIS — J029 Acute pharyngitis, unspecified: Secondary | ICD-10-CM | POA: Diagnosis not present

## 2016-06-22 DIAGNOSIS — B9789 Other viral agents as the cause of diseases classified elsewhere: Secondary | ICD-10-CM

## 2016-06-22 DIAGNOSIS — R05 Cough: Secondary | ICD-10-CM | POA: Diagnosis not present

## 2016-06-22 DIAGNOSIS — R030 Elevated blood-pressure reading, without diagnosis of hypertension: Secondary | ICD-10-CM | POA: Diagnosis not present

## 2016-06-22 DIAGNOSIS — R059 Cough, unspecified: Secondary | ICD-10-CM

## 2016-06-22 DIAGNOSIS — J069 Acute upper respiratory infection, unspecified: Secondary | ICD-10-CM | POA: Diagnosis not present

## 2016-06-22 MED ORDER — BENZONATATE 100 MG PO CAPS: 100.0000 mg | ORAL_CAPSULE | Freq: Three times a day (TID) | ORAL | 0 refills | Status: DC | PRN

## 2016-06-22 MED ORDER — HYDROCODONE-HOMATROPINE 5-1.5 MG/5ML PO SYRP: 5.0000 mL | ORAL_SOLUTION | Freq: Every evening | ORAL | 0 refills | Status: DC | PRN

## 2016-06-22 NOTE — Patient Instructions (Addendum)
Upper Respiratory Infection, Adult Most upper respiratory infections (URIs) are a viral infection of the air passages leading to the lungs. A URI affects the nose, throat, and upper air passages. The most common type of URI is nasopharyngitis and is typically referred to as "the common cold." URIs run their course and usually go away on their own. Most of the time, a URI does not require medical attention, but sometimes a bacterial infection in the upper airways can follow a viral infection. This is called a secondary infection. Sinus and middle ear infections are common types of secondary upper respiratory infections. Bacterial pneumonia can also complicate a URI. A URI can worsen asthma and chronic obstructive pulmonary disease (COPD). Sometimes, these complications can require emergency medical care and may be life threatening. What are the causes? Almost all URIs are caused by viruses. A virus is a type of germ and can spread from one person to another. What increases the risk? You may be at risk for a URI if:  You smoke.  You have chronic heart or lung disease.  You have a weakened defense (immune) system.  You are very young or very old.  You have nasal allergies or asthma.  You work in crowded or poorly ventilated areas.  You work in health care facilities or schools.  What are the signs or symptoms? Symptoms typically develop 2-3 days after you come in contact with a cold virus. Most viral URIs last 7-10 days. However, viral URIs from the influenza virus (flu virus) can last 14-18 days and are typically more severe. Symptoms may include:  Runny or stuffy (congested) nose.  Sneezing.  Cough.  Sore throat.  Headache.  Fatigue.  Fever.  Loss of appetite.  Pain in your forehead, behind your eyes, and over your cheekbones (sinus pain).  Muscle aches.  How is this diagnosed? Your health care provider may diagnose a URI by:  Physical exam.  Tests to check that your  symptoms are not due to another condition such as: ? Strep throat. ? Sinusitis. ? Pneumonia. ? Asthma.  How is this treated? A URI goes away on its own with time. It cannot be cured with medicines, but medicines may be prescribed or recommended to relieve symptoms. Medicines may help:  Reduce your fever.  Reduce your cough.  Relieve nasal congestion.  Follow these instructions at home:  Take medicines only as directed by your health care provider.  Gargle warm saltwater or take cough drops to comfort your throat as directed by your health care provider.  Use a warm mist humidifier or inhale steam from a shower to increase air moisture. This may make it easier to breathe.  Drink enough fluid to keep your urine clear or pale yellow.  Eat soups and other clear broths and maintain good nutrition.  Rest as needed.  Return to work when your temperature has returned to normal or as your health care provider advises. You may need to stay home longer to avoid infecting others. You can also use a face mask and careful hand washing to prevent spread of the virus.  Increase the usage of your inhaler if you have asthma.  Do not use any tobacco products, including cigarettes, chewing tobacco, or electronic cigarettes. If you need help quitting, ask your health care provider. How is this prevented? The best way to protect yourself from getting a cold is to practice good hygiene.  Avoid oral or hand contact with people with cold symptoms.  Wash your   hands often if contact occurs.  There is no clear evidence that vitamin C, vitamin E, echinacea, or exercise reduces the chance of developing a cold. However, it is always recommended to get plenty of rest, exercise, and practice good nutrition. Contact a health care provider if:  You are getting worse rather than better.  Your symptoms are not controlled by medicine.  You have chills.  You have worsening shortness of breath.  You have  brown or red mucus.  You have yellow or brown nasal discharge.  You have pain in your face, especially when you bend forward.  You have a fever.  You have swollen neck glands.  You have pain while swallowing.  You have white areas in the back of your throat. Get help right away if:  You have severe or persistent: ? Headache. ? Ear pain. ? Sinus pain. ? Chest pain.  You have chronic lung disease and any of the following: ? Wheezing. ? Prolonged cough. ? Coughing up blood. ? A change in your usual mucus.  You have a stiff neck.  You have changes in your: ? Vision. ? Hearing. ? Thinking. ? Mood. This information is not intended to replace advice given to you by your health care provider. Make sure you discuss any questions you have with your health care provider. Document Released: 11/02/2000 Document Revised: 01/10/2016 Document Reviewed: 08/14/2013 Elsevier Interactive Patient Education  2017 Elsevier Inc.     IF you received an x-ray today, you will receive an invoice from [facility]. Please contact [facility] at [phone] with questions or concerns regarding your invoice.   IF you received labwork today, you will receive an invoice from [facility]. Please contact [facility] at [phone] with questions or concerns regarding your invoice.   Our billing staff will not be able to assist you with questions regarding bills from these companies.  You will be contacted with the lab results as soon as they are available. The fastest way to get your results is to activate your My Chart account. Instructions are located on the last page of this paperwork. If you have not heard from us regarding the results in 2 weeks, please contact this office.     

## 2016-06-22 NOTE — Progress Notes (Signed)
  MRN: [id number] DOB: 05-18-1951  Subjective:   Toni Reyes is a 65 y.o. female presenting for chief complaint of Cough  Reports 1 week history of productive cough, fever (100F was highest). Cough elicits wheezing, dizziness. Associated symptoms include nasal congestion, right ear pain, sore throat, throat congestion. Has tried otc medications with some relief. Denies sinus pain, ear drainage, chest pain, shob, n/v, abdominal pain, rashes, body aches. Denies history of allergies, asthma. She did not have her flu shot this season. Denies smoking cigarettes.  Toni Reyes has a current medication list which includes the following prescription(s): bupropion and venlafaxine xr. Also has No Known Allergies.  Toni Reyes  has a past medical history of Depression; High cholesterol; and Hypertension. Also  has a past surgical history that includes Craniotomy; Cesarean section; Tubal ligation; Laminectomy; Breast surgery; Cholecystectomy; Eye surgery; and Brain surgery.  Objective:   Vitals: BP (!) 150/86 (BP Location: Right Arm, Cuff Size: Normal)   Pulse 85   Temp 97.7 F (36.5 C) (Oral)   Resp 18   Ht 5\' 1"  (1.549 m)   Wt 176 lb 6.4 oz (80 kg)   SpO2 97%   BMI 33.33 kg/m   BP Readings from Last 3 Encounters:  06/22/16 (!) 150/86  09/14/15 138/82  03/10/15 (!) 142/80    Physical Exam  Constitutional: She is oriented to person, place, and time. She appears well-developed and well-nourished.  Eyes: Right eye exhibits no discharge. Left eye exhibits no discharge.  Neck: Normal range of motion. Neck supple.  Cardiovascular: Normal rate, regular rhythm and intact distal pulses.  Exam reveals no gallop and no friction rub.   No murmur heard. Pulmonary/Chest: No respiratory distress. She has no wheezes. She has no rales.  Neurological: She is alert and oriented to person, place, and time.  Skin: Skin is warm and dry.   Assessment and Plan :   1. Viral URI 2. Cough 3. Sore throat 4. Right ear  pain - Patient notes that she feels better today but wanted to make sure she got checked still. I suspect she is at the tail end of a viral URI with cough, recommended supportive care and cough suppression medications. RTC in 1 week or sooner if no improvement.   5. Elevated blood pressure reading - Patient is to check her BP twice weekly at home. Follow up in 4 weeks for recheck of BP.   Jaynee Eagles, PA-C [facility] [id number] 06/22/2016  9:16 AM

## 2016-11-01 ENCOUNTER — Ambulatory Visit (INDEPENDENT_AMBULATORY_CARE_PROVIDER_SITE_OTHER): Payer: Medicare Other | Admitting: Physician Assistant

## 2016-11-01 ENCOUNTER — Encounter: Payer: Self-pay | Admitting: Physician Assistant

## 2016-11-01 VITALS — BP 149/77 | HR 96 | Temp 98.7°F | Resp 16 | Ht 61.0 in | Wt 173.8 lb

## 2016-11-01 DIAGNOSIS — I10 Essential (primary) hypertension: Secondary | ICD-10-CM

## 2016-11-01 DIAGNOSIS — F331 Major depressive disorder, recurrent, moderate: Secondary | ICD-10-CM

## 2016-11-01 MED ORDER — VENLAFAXINE HCL ER 150 MG PO CP24: 150.0000 mg | ORAL_CAPSULE | Freq: Every day | ORAL | 3 refills | Status: DC

## 2016-11-01 MED ORDER — AMLODIPINE BESYLATE 2.5 MG PO TABS
2.5000 mg | ORAL_TABLET | Freq: Every day | ORAL | 0 refills | Status: DC
Start: 2016-11-01 — End: 2016-12-20

## 2016-11-01 MED ORDER — BUPROPION HCL ER (XL) 150 MG PO TB24: 150.0000 mg | ORAL_TABLET | Freq: Every day | ORAL | 3 refills | Status: DC

## 2016-11-01 NOTE — Patient Instructions (Signed)
     IF you received an x-ray today, you will receive an invoice from [facility]. Please contact [facility] at [phone] with questions or concerns regarding your invoice.   IF you received labwork today, you will receive an invoice from [facility]. Please contact [facility] at [phone] with questions or concerns regarding your invoice.   Our billing staff will not be able to assist you with questions regarding bills from these companies.  You will be contacted with the lab results as soon as they are available. The fastest way to get your results is to activate your My Chart account. Instructions are located on the last page of this paperwork. If you have not heard from us regarding the results in 2 weeks, please contact this office.     

## 2016-11-01 NOTE — Progress Notes (Signed)
11/01/2016 4:01 PM   DOB: 1951/04/28 / MRN: [id number]  SUBJECTIVE:  Toni Reyes is a 66 y.o. female presenting for medication refills of depression medications. She has a long history of depression, states that she thinks that she has been this way since a teenager. Had a "breakdown at work in 1986" and this manifested as an uncontrollable crying spell.  This was followed with prozac and talk therapy. Felt that the Prozac was not helping her as much. Tells me that her biggest problem with regard to depression is lethargy and decreased concentration.  Had considered suicide "several times throughout the years" never had a plan and never wanted to act on this. Also complains of chronic sadness, and also states that she overeats, and will sleep too much if not one medication. Does have some guilt over her behavior well into her thirties and these were sexual behaviors. States that her symptoms are roughly a 6/10 but would like to be a 3-4/10. She does have a psychologist and is looking for a psychiatrist. She goes to bed at about 3 am, wakes up at 8 am and does not go back to sleep. She has tried teas for this. She would like to go to bed at 11pm.   Tells me that she has a history of HTN and was medicated for this. Stopped taking the medication for insurance reasons.   She has No Known Allergies.   She  has a past medical history of Depression; High cholesterol; and Hypertension.    She  reports that she has quit smoking. She has never used smokeless tobacco. She reports that she drinks alcohol. She reports that she does not use drugs. She  reports that she does not engage in sexual activity. The patient  has a past surgical history that includes Craniotomy; Cesarean section; Tubal ligation; Laminectomy; Breast surgery; Cholecystectomy; Eye surgery; and Brain surgery.  Her family history includes Cancer in her father, maternal grandfather, sister, sister, and sister; Emphysema in her mother, sister,  sister, and sister.  Review of Systems  Constitutional: Negative for chills and fever.  Respiratory: Negative for cough and shortness of breath.   Cardiovascular: Negative for chest pain and leg swelling.  Gastrointestinal: Negative for nausea.  Skin: Negative for rash.    The problem list and medications were reviewed and updated by myself where necessary and exist elsewhere in the encounter.   OBJECTIVE:  BP (!) 149/77 (BP Location: Right Arm, Patient Position: Sitting, Cuff Size: Large)   Pulse 96   Temp 98.7 F (37.1 C) (Oral)   Resp 16   Ht 5\' 1"  (1.549 m)   Wt 173 lb 12.8 oz (78.8 kg)   SpO2 97%   BMI 32.84 kg/m   BP Readings from Last 3 Encounters:  11/01/16 (!) 149/77  06/22/16 (!) 150/86  09/14/15 138/82     Physical Exam  Constitutional: She is active.  Non-toxic appearance.  Cardiovascular: Normal rate, regular rhythm, S1 normal, S2 normal, normal heart sounds and intact distal pulses.  Exam reveals no gallop, no friction rub and no decreased pulses.   No murmur heard. Pulmonary/Chest: Effort normal. No stridor. No tachypnea. No respiratory distress. She has no wheezes. She has no rales.  Abdominal: She exhibits no distension.  Musculoskeletal: She exhibits edema (trace at best).  Neurological: She is alert.  Skin: Skin is warm and dry. She is not diaphoretic. No pallor.  Psychiatric: She has a normal mood and affect. Her behavior is  normal. Judgment and thought content normal.    No results found for this or any previous visit (from the past 72 hour(s)).  No results found.  ASSESSMENT AND PLAN:  Jerae was seen today for medication refill.  Diagnoses and all orders for this visit:  Essential hypertension: She plans to find a PCP closer to her home and does not want labs today.  Advised that she needs BP medication to protect her until that she finds a PCP. Advised that I am happy to see her anytime and would be glad to assume her care.  She is willing  to start Norvasc at a low dose which does not require monitoring.  -     amLODipine (NORVASC) 2.5 MG tablet; Take 1 tablet (2.5 mg total) by mouth daily.  Moderate episode of recurrent major depressive disorder [facility]): She plans to find a psychiatrist. Will maintain her medications until that time.  -     buPROPion (WELLBUTRIN XL) 150 MG 24 hr tablet; Take 1 tablet (150 mg total) by mouth daily. -     venlafaxine XR (EFFEXOR-XR) 150 MG 24 hr capsule; Take 1 capsule (150 mg total) by mouth daily.    The patient is advised to call or return to clinic if she does not see an improvement in symptoms, or to seek the care of the closest emergency department if she worsens with the above plan.   Philis Fendt, MHS, PA-C [facility] 11/01/2016 4:01 PM

## 2016-12-20 ENCOUNTER — Other Ambulatory Visit: Payer: Self-pay | Admitting: Physician Assistant

## 2016-12-20 DIAGNOSIS — I10 Essential (primary) hypertension: Secondary | ICD-10-CM

## 2016-12-22 ENCOUNTER — Ambulatory Visit (INDEPENDENT_AMBULATORY_CARE_PROVIDER_SITE_OTHER): Payer: Medicare Other | Admitting: Adult Health

## 2016-12-22 ENCOUNTER — Encounter: Payer: Self-pay | Admitting: Adult Health

## 2016-12-22 VITALS — BP 123/80 | HR 77 | Ht 61.0 in | Wt 172.4 lb

## 2016-12-22 DIAGNOSIS — I10 Essential (primary) hypertension: Secondary | ICD-10-CM | POA: Diagnosis not present

## 2016-12-22 DIAGNOSIS — R5383 Other fatigue: Secondary | ICD-10-CM | POA: Diagnosis not present

## 2016-12-22 DIAGNOSIS — F329 Major depressive disorder, single episode, unspecified: Secondary | ICD-10-CM

## 2016-12-22 DIAGNOSIS — F32A Depression, unspecified: Secondary | ICD-10-CM

## 2016-12-22 DIAGNOSIS — E785 Hyperlipidemia, unspecified: Secondary | ICD-10-CM | POA: Diagnosis not present

## 2016-12-22 DIAGNOSIS — F339 Major depressive disorder, recurrent, unspecified: Secondary | ICD-10-CM

## 2016-12-22 DIAGNOSIS — Z0000 Encounter for general adult medical examination without abnormal findings: Secondary | ICD-10-CM | POA: Diagnosis not present

## 2016-12-22 NOTE — Patient Instructions (Signed)
Hypertension Hypertension, commonly called high blood pressure, is when the force of blood pumping through the arteries is too strong. The arteries are the blood vessels that carry blood from the heart throughout the body. Hypertension forces the heart to work harder to pump blood and may cause arteries to become narrow or stiff. Having untreated or uncontrolled hypertension can cause heart attacks, strokes, kidney disease, and other problems. A blood pressure reading consists of a higher number over a lower number. Ideally, your blood pressure should be below 120/80. The first ("top") number is called the systolic pressure. It is a measure of the pressure in your arteries as your heart beats. The second ("bottom") number is called the diastolic pressure. It is a measure of the pressure in your arteries as the heart relaxes. What are the causes? The cause of this condition is not known. What increases the risk? Some risk factors for high blood pressure are under your control. Others are not. Factors you can change  Smoking.  Having type 2 diabetes mellitus, high cholesterol, or both.  Not getting enough exercise or physical activity.  Being overweight.  Having too much fat, sugar, calories, or salt (sodium) in your diet.  Drinking too much alcohol. Factors that are difficult or impossible to change  Having chronic kidney disease.  Having a family history of high blood pressure.  Age. Risk increases with age.  Race. You may be at higher risk if you are African-American.  Gender. Men are at higher risk than women before age 45. After age 65, women are at higher risk than men.  Having obstructive sleep apnea.  Stress. What are the signs or symptoms? Extremely high blood pressure (hypertensive crisis) may cause:  Headache.  Anxiety.  Shortness of breath.  Nosebleed.  Nausea and vomiting.  Severe chest pain.  Jerky movements you cannot control (seizures).  How is this  diagnosed? This condition is diagnosed by measuring your blood pressure while you are seated, with your arm resting on a surface. The cuff of the blood pressure monitor will be placed directly against the skin of your upper arm at the level of your heart. It should be measured at least twice using the same arm. Certain conditions can cause a difference in blood pressure between your right and left arms. Certain factors can cause blood pressure readings to be lower or higher than normal (elevated) for a short period of time:  When your blood pressure is higher when you are in a health care provider's office than when you are at home, this is called white coat hypertension. Most people with this condition do not need medicines.  When your blood pressure is higher at home than when you are in a health care provider's office, this is called masked hypertension. Most people with this condition may need medicines to control blood pressure.  If you have a high blood pressure reading during one visit or you have normal blood pressure with other risk factors:  You may be asked to return on a different day to have your blood pressure checked again.  You may be asked to monitor your blood pressure at home for 1 week or longer.  If you are diagnosed with hypertension, you may have other blood or imaging tests to help your health care provider understand your overall risk for other conditions. How is this treated? This condition is treated by making healthy lifestyle changes, such as eating healthy foods, exercising more, and reducing your alcohol intake. Your   health care provider may prescribe medicine if lifestyle changes are not enough to get your blood pressure under control, and if:  Your systolic blood pressure is above 130.  Your diastolic blood pressure is above 80.  Your personal target blood pressure may vary depending on your medical conditions, your age, and other factors. Follow these  instructions at home: Eating and drinking  Eat a diet that is high in fiber and potassium, and low in sodium, added sugar, and fat. An example eating plan is called the DASH (Dietary Approaches to Stop Hypertension) diet. To eat this way: ? Eat plenty of fresh fruits and vegetables. Try to fill half of your plate at each meal with fruits and vegetables. ? Eat whole grains, such as whole wheat pasta, brown rice, or whole grain bread. Fill about one quarter of your plate with whole grains. ? Eat or drink low-fat dairy products, such as skim milk or low-fat yogurt. ? Avoid fatty cuts of meat, processed or cured meats, and poultry with skin. Fill about one quarter of your plate with lean proteins, such as fish, chicken without skin, beans, eggs, and tofu. ? Avoid premade and processed foods. These tend to be higher in sodium, added sugar, and fat.  Reduce your daily sodium intake. Most people with hypertension should eat less than 1,500 mg of sodium a day.  Limit alcohol intake to no more than 1 drink a day for nonpregnant women and 2 drinks a day for men. One drink equals 12 oz of beer, 5 oz of wine, or 1 oz of hard liquor. Lifestyle  Work with your health care provider to maintain a healthy body weight or to lose weight. Ask what an ideal weight is for you.  Get at least 30 minutes of exercise that causes your heart to beat faster (aerobic exercise) most days of the week. Activities may include walking, swimming, or biking.  Include exercise to strengthen your muscles (resistance exercise), such as pilates or lifting weights, as part of your weekly exercise routine. Try to do these types of exercises for 30 minutes at least 3 days a week.  Do not use any products that contain nicotine or tobacco, such as cigarettes and e-cigarettes. If you need help quitting, ask your health care provider.  Monitor your blood pressure at home as told by your health care provider.  Keep all follow-up visits as  told by your health care provider. This is important. Medicines  Take over-the-counter and prescription medicines only as told by your health care provider. Follow directions carefully. Blood pressure medicines must be taken as prescribed.  Do not skip doses of blood pressure medicine. Doing this puts you at risk for problems and can make the medicine less effective.  Ask your health care provider about side effects or reactions to medicines that you should watch for. Contact a health care provider if:  You think you are having a reaction to a medicine you are taking.  You have headaches that keep coming back (recurring).  You feel dizzy.  You have swelling in your ankles.  You have trouble with your vision. Get help right away if:  You develop a severe headache or confusion.  You have unusual weakness or numbness.  You feel faint.  You have severe pain in your chest or abdomen.  You vomit repeatedly.  You have trouble breathing. Summary  Hypertension is when the force of blood pumping through your arteries is too strong. If this condition is not   controlled, it may put you at risk for serious complications.  Your personal target blood pressure may vary depending on your medical conditions, your age, and other factors. For most people, a normal blood pressure is less than 120/80.  Hypertension is treated with lifestyle changes, medicines, or a combination of both. Lifestyle changes include weight loss, eating a healthy, low-sodium diet, exercising more, and limiting alcohol. This information is not intended to replace advice given to you by your health care provider. Make sure you discuss any questions you have with your health care provider. Document Released: 05/09/2005 Document Revised: 04/06/2016 Document Reviewed: 04/06/2016 Elsevier Interactive Patient Education  2018 [employer].   Heart-Healthy Eating Plan Many factors influence your heart health, including  eating and exercise habits. Heart (coronary) risk increases with abnormal blood fat (lipid) levels. Heart-healthy meal planning includes limiting unhealthy fats, increasing healthy fats, and making other small dietary changes. This includes maintaining a healthy body weight to help keep lipid levels within a normal range. What is my plan? Your health care provider recommends that you:  Get no more than _________% of the total calories in your daily diet from fat.  Limit your intake of saturated fat to less than _________% of your total calories each day.  Limit the amount of cholesterol in your diet to less than _________ mg per day.  What types of fat should I choose?  Choose healthy fats more often. Choose monounsaturated and polyunsaturated fats, such as olive oil and canola oil, flaxseeds, walnuts, almonds, and seeds.  Eat more omega-3 fats. Good choices include salmon, mackerel, sardines, tuna, flaxseed oil, and ground flaxseeds. Aim to eat fish at least two times each week.  Limit saturated fats. Saturated fats are primarily found in animal products, such as meats, butter, and cream. Plant sources of saturated fats include palm oil, palm kernel oil, and coconut oil.  Avoid foods with partially hydrogenated oils in them. These contain trans fats. Examples of foods that contain trans fats are stick margarine, some tub margarines, cookies, crackers, and other baked goods. What general guidelines do I need to follow?  Check food labels carefully to identify foods with trans fats or high amounts of saturated fat.  Fill one half of your plate with vegetables and green salads. Eat 4-5 servings of vegetables per day. A serving of vegetables equals 1 cup of raw leafy vegetables,  cup of raw or cooked cut-up vegetables, or  cup of vegetable juice.  Fill one fourth of your plate with whole grains. Look for the word "whole" as the first word in the ingredient list.  Fill one fourth of your  plate with lean protein foods.  Eat 4-5 servings of fruit per day. A serving of fruit equals one medium whole fruit,  cup of dried fruit,  cup of fresh, frozen, or canned fruit, or  cup of 100% fruit juice.  Eat more foods that contain soluble fiber. Examples of foods that contain this type of fiber are apples, broccoli, carrots, beans, peas, and barley. Aim to get 20-30 g of fiber per day.  Eat more home-cooked food and less restaurant, buffet, and fast food.  Limit or avoid alcohol.  Limit foods that are high in starch and sugar.  Avoid fried foods.  Cook foods by using methods other than frying. Baking, boiling, grilling, and broiling are all great options. Other fat-reducing suggestions include: ? Removing the skin from poultry. ? Removing all visible fats from meats. ? Skimming the fat off  of stews, soups, and gravies before serving them. ? Steaming vegetables in water or broth.  Lose weight if you are overweight. Losing just 5-10% of your initial body weight can help your overall health and prevent diseases such as diabetes and heart disease.  Increase your consumption of nuts, legumes, and seeds to 4-5 servings per week. One serving of dried beans or legumes equals  cup after being cooked, one serving of nuts equals 1 ounces, and one serving of seeds equals  ounce or 1 tablespoon.  You may need to monitor your salt (sodium) intake, especially if you have high blood pressure. Talk with your health care provider or dietitian to get more information about reducing sodium. What foods can I eat? Grains  Breads, including [place], white, pita, wheat, raisin, rye, oatmeal, and [place]. Tortillas that are neither fried nor made with lard or trans fat. Low-fat rolls, including hotdog and hamburger buns and English muffins. Biscuits. Muffins. Waffles. Pancakes. Light popcorn. Whole-grain cereals. Flatbread. Melba toast. Pretzels. Breadsticks. Rusks. Low-fat snacks and crackers,  including oyster, saltine, matzo, graham, animal, and rye. Rice and pasta, including brown rice and those that are made with whole wheat. Vegetables All vegetables. Fruits All fruits, but limit coconut. Meats and Other Protein Sources Lean, well-trimmed beef, veal, pork, and lamb. Chicken and [place] without skin. All fish and shellfish. Wild duck, rabbit, pheasant, and venison. Egg whites or low-cholesterol egg substitutes. Dried beans, peas, lentils, and tofu.Seeds and most nuts. Dairy Low-fat or nonfat cheeses, including ricotta, string, and mozzarella. Skim or 1% milk that is liquid, powdered, or evaporated. Buttermilk that is made with low-fat milk. Nonfat or low-fat yogurt. Beverages Mineral water. Diet carbonated beverages. Sweets and Desserts Sherbets and fruit ices. Honey, jam, marmalade, jelly, and syrups. Meringues and gelatins. Pure sugar candy, such as hard candy, jelly beans, gumdrops, mints, marshmallows, and small amounts of dark chocolate. [employer] Eat all sweets and desserts in moderation. Fats and Oils Nonhydrogenated (trans-free) margarines. Vegetable oils, including soybean, sesame, sunflower, olive, peanut, safflower, corn, canola, and cottonseed. Salad dressings or mayonnaise that are made with a vegetable oil. Limit added fats and oils that you use for cooking, baking, salads, and as spreads. Other Cocoa powder. Coffee and tea. All seasonings and condiments. The items listed above may not be a complete list of recommended foods or beverages. Contact your dietitian for more options. What foods are not recommended? Grains Breads that are made with saturated or trans fats, oils, or whole milk. Croissants. Butter rolls. Cheese breads. Sweet rolls. Donuts. Buttered popcorn. Chow mein noodles. High-fat crackers, such as cheese or butter crackers. Meats and Other Protein Sources Fatty meats, such as hotdogs, short ribs, sausage, spareribs, bacon, ribeye roast or steak,  and mutton. High-fat deli meats, such as salami and bologna. Caviar. Domestic duck and goose. Organ meats, such as kidney, liver, sweetbreads, brains, gizzard, chitterlings, and heart. Dairy Cream, sour cream, cream cheese, and creamed cottage cheese. Whole milk cheeses, including blue (bleu), [place], [place], [place], [place], [place], [place], [place], [place], and [place]. Whole or 2% milk that is liquid, evaporated, or condensed. Whole buttermilk. Cream sauce or high-fat cheese sauce. Yogurt that is made from whole milk. Beverages Regular sodas and drinks with added sugar. Sweets and Desserts Frosting. Pudding. Cookies. Cakes other than angel food cake. Candy that has milk chocolate or white chocolate, hydrogenated fat, butter, coconut, or unknown ingredients. Buttered syrups. Full-fat ice cream or ice cream drinks. Fats and Oils Gravy that has suet,  meat fat, or shortening. Cocoa butter, hydrogenated oils, palm oil, coconut oil, palm kernel oil. These can often be found in baked products, candy, fried foods, nondairy creamers, and whipped toppings. Solid fats and shortenings, including bacon fat, salt pork, lard, and butter. Nondairy cream substitutes, such as coffee creamers and sour cream substitutes. Salad dressings that are made of unknown oils, cheese, or sour cream. The items listed above may not be a complete list of foods and beverages to avoid. Contact your dietitian for more information. This information is not intended to replace advice given to you by your health care provider. Make sure you discuss any questions you have with your health care provider. Document Released: 02/16/2008 Document Revised: 11/27/2015 Document Reviewed: 10/31/2013 Elsevier Interactive Patient Education  2017 [place] all medications as directed. Please schedule nurse visit for fasting labs at your convenience. Please schedule complete physical in 3 months. WELCOME TO THE PRACTICE!

## 2016-12-22 NOTE — Assessment & Plan Note (Signed)
Psychiatrist referral placed. Continue venlafaxine 170m and bupropion 150mg  daily.

## 2016-12-22 NOTE — Assessment & Plan Note (Signed)
Will obtain fasting labs ASAP. Follow heart healthy diet.

## 2016-12-22 NOTE — Assessment & Plan Note (Signed)
Will check TSH and vit d levels.

## 2016-12-22 NOTE — Assessment & Plan Note (Signed)
Increase water intake, strive for at least 80 ounces/day. Follow heart healthy diet and increase daily walking. Encouraged to eat at least 3 small meals a day, not just one huge meal late in the day. Fasting labs ASAP, CPE in 3 months.

## 2016-12-22 NOTE — Progress Notes (Signed)
Subjective:    Patient ID: Toni Reyes, female    DOB: 1950-07-24, 66 y.o.   MRN: [id number]  HPI:  Toni Reyes is here to establish as a new pt.  She is a very pleasant 66 year old female.  PMH:  HTN, depression, HL, generalized fatigue, and notably surgical hx of hemangioma in 2002.  She quit smoking 1986 and overall she feels "pretty healthy".  She is compliant on all medications and denies SE.  She estimates to drink 30-40 ounces water a day and eats only one, large meal usually in the evening.  She is happily retired and hobbies include reading, television, and "looking at things on my phone".   She denies any acute issues/complaints.  Patient Care Team    Relationship Specialty Notifications Start End  Patient, No Pcp Per PCP - [facility]  06/25/13    Comment: Merged    Patient Active Problem List   Diagnosis Date Noted  . Depression 03/10/2015     Past Medical History:  Diagnosis Date  . Depression   . High cholesterol   . Hypertension      Past Surgical History:  Procedure Laterality Date  . BRAIN SURGERY     meningioma  . BREAST SURGERY     augmentation  . CESAREAN SECTION    . CHOLECYSTECTOMY    . CRANIOTOMY    . EYE SURGERY Bilateral    Lasix  . LAMINECTOMY    . TUBAL LIGATION       Family History  Problem Relation Age of Onset  . Emphysema Mother   . Cancer Father        metastatic  . Cancer Sister        lung  . Emphysema Sister   . Healthy Daughter   . Healthy Son   . Cancer Maternal Grandfather        bladder  . Cancer Sister        melanoma  . COPD Sister   . CAD Brother   . Stroke Brother   . Healthy Brother   . Healthy Brother      History  Drug Use No     History  Alcohol Use  . Yes    Comment: rarely     History  Smoking Status  . Former Smoker  . Packs/day: 2.00  . Years: 26.00  . Types: Cigarettes  . Quit date: 02/22/1985  Smokeless Tobacco  . Never Used     Outpatient Encounter Prescriptions as  of 12/22/2016  Medication Sig  . amLODipine (NORVASC) 2.5 MG tablet TAKE 1 TABLET BY MOUTH  DAILY  . buPROPion (WELLBUTRIN XL) 150 MG 24 hr tablet Take 1 tablet (150 mg total) by mouth daily.  Marland Kitchen venlafaxine XR (EFFEXOR-XR) 150 MG 24 hr capsule Take 1 capsule (150 mg total) by mouth daily.   No facility-administered encounter medications on file as of 12/22/2016.     Allergies: Patient has no known allergies.  Body mass index is 32.57 kg/m.  Blood pressure 123/80, pulse 77, height 5\' 1"  (1.549 m), weight 172 lb 6.4 oz (78.2 kg).     Review of Systems  Constitutional: Positive for fatigue. Negative for activity change, appetite change, chills, diaphoresis, fever and unexpected weight change.  HENT: Negative for congestion.   Eyes: Negative for visual disturbance.  Respiratory: Negative for cough, chest tightness, shortness of breath, wheezing and stridor.   Cardiovascular: Negative for chest pain, palpitations and leg swelling.  Gastrointestinal: Negative for abdominal distention, abdominal pain, blood in stool, constipation, diarrhea, nausea and vomiting.  Endocrine: Negative for cold intolerance, heat intolerance, polydipsia, polyphagia and polyuria.  Genitourinary: Negative for difficulty urinating, flank pain and hematuria.  Musculoskeletal: Positive for arthralgias, back pain and myalgias. Negative for gait problem, joint swelling, neck pain and neck stiffness.       Intermittent R hip pain-none during encounter today.  Skin: Negative for color change, pallor, rash and wound.  Neurological: Negative for dizziness, tremors and weakness.  Hematological: Does not bruise/bleed easily.  Psychiatric/Behavioral: Positive for dysphoric mood. Negative for confusion, decreased concentration, hallucinations, self-injury, sleep disturbance and suicidal ideas. The patient is not nervous/anxious and is not hyperactive.        Objective:   Physical Exam  Constitutional: She is oriented to  person, place, and time. She appears well-developed and well-nourished. No distress.  HENT:  Head: Normocephalic and atraumatic.  Right Ear: Hearing, tympanic membrane, external ear and ear canal normal. Tympanic membrane is not erythematous and not bulging. No decreased hearing is noted.  Left Ear: Hearing, tympanic membrane, external ear and ear canal normal. Tympanic membrane is not erythematous and not bulging. No decreased hearing is noted.  Nose: Nose normal. Right sinus exhibits no maxillary sinus tenderness and no frontal sinus tenderness. Left sinus exhibits no maxillary sinus tenderness and no frontal sinus tenderness.  Mouth/Throat: Uvula is midline, oropharynx is clear and moist and mucous membranes are normal.  Eyes: Pupils are equal, round, and reactive to light. Conjunctivae are normal.  Neck: Normal range of motion. Neck supple.  Cardiovascular: Normal rate, regular rhythm, normal heart sounds and intact distal pulses.   No murmur heard. Pulmonary/Chest: Effort normal and breath sounds normal. No respiratory distress. She has no wheezes. She has no rales. She exhibits no tenderness.  Musculoskeletal: Normal range of motion. She exhibits no edema or tenderness.  Lymphadenopathy:    She has no cervical adenopathy.  Neurological: She is alert and oriented to person, place, and time.  Skin: Skin is warm and dry. No rash noted. She is not diaphoretic. No erythema. No pallor.  Psychiatric: She has a normal mood and affect. Her behavior is normal. Judgment and thought content normal.  Nursing note and vitals reviewed.         Assessment & Plan:   1. Fatigue, unspecified type   2. Hyperlipidemia, unspecified hyperlipidemia type   3. Hypertension, unspecified type   4. Depression, recurrent ([place])   5. Depression, unspecified depression type   6. Healthcare maintenance     Depression Psychiatrist referral placed. Continue venlafaxine 116m and bupropion 150mg   daily.   Hyperlipidemia Will obtain fasting labs ASAP. Follow heart healthy diet.  Fatigue Will check TSH and vit d levels.  Healthcare maintenance Increase water intake, strive for at least 80 ounces/day. Follow heart healthy diet and increase daily walking. Encouraged to eat at least 3 small meals a day, not just one huge meal late in the day. Fasting labs ASAP, CPE in 3 months.    FOLLOW-UP:  Return in about 3 months (around 03/24/2017) for CPE.

## 2017-03-20 ENCOUNTER — Other Ambulatory Visit: Payer: Medicare Other

## 2017-03-20 DIAGNOSIS — I10 Essential (primary) hypertension: Secondary | ICD-10-CM

## 2017-03-20 DIAGNOSIS — E785 Hyperlipidemia, unspecified: Secondary | ICD-10-CM

## 2017-03-20 DIAGNOSIS — R5383 Other fatigue: Secondary | ICD-10-CM

## 2017-03-21 LAB — CBC WITH DIFFERENTIAL/PLATELET
BASOS ABS: 0 10*3/uL (ref 0.0–0.2)
BASOS: 0 %
EOS (ABSOLUTE): 0.1 10*3/uL (ref 0.0–0.4)
Eos: 1 %
HEMOGLOBIN: 15.4 g/dL (ref 11.1–15.9)
Hematocrit: 46.3 % (ref 34.0–46.6)
IMMATURE GRANS (ABS): 0 10*3/uL (ref 0.0–0.1)
Immature Granulocytes: 0 %
LYMPHS: 41 %
Lymphocytes Absolute: 3.5 10*3/uL — ABNORMAL HIGH (ref 0.7–3.1)
MCH: 30.3 pg (ref 26.6–33.0)
MCHC: 33.3 g/dL (ref 31.5–35.7)
MCV: 91 fL (ref 79–97)
MONOCYTES: 5 %
Monocytes Absolute: 0.4 10*3/uL (ref 0.1–0.9)
NEUTROS ABS: 4.5 10*3/uL (ref 1.4–7.0)
Neutrophils: 53 %
Platelets: 270 10*3/uL (ref 150–379)
RBC: 5.08 x10E6/uL (ref 3.77–5.28)
RDW: 14.1 % (ref 12.3–15.4)
WBC: 8.5 10*3/uL (ref 3.4–10.8)

## 2017-03-21 LAB — COMPREHENSIVE METABOLIC PANEL
ALT: 20 IU/L (ref 0–32)
AST: 16 IU/L (ref 0–40)
Albumin/Globulin Ratio: 1.8 (ref 1.2–2.2)
Albumin: 4.1 g/dL (ref 3.6–4.8)
Alkaline Phosphatase: 120 IU/L — ABNORMAL HIGH (ref 39–117)
BUN / CREAT RATIO: 17 (ref 12–28)
BUN: 16 mg/dL (ref 8–27)
Bilirubin Total: 0.5 mg/dL (ref 0.0–1.2)
CALCIUM: 9 mg/dL (ref 8.7–10.3)
CHLORIDE: 107 mmol/L — AB (ref 96–106)
CO2: 22 mmol/L (ref 20–29)
CREATININE: 0.92 mg/dL (ref 0.57–1.00)
GFR calc Af Amer: 74 mL/min/{1.73_m2} (ref >59)
GFR, EST NON AFRICAN AMERICAN: 64 mL/min/{1.73_m2} (ref >59)
GLOBULIN, TOTAL: 2.3 g/dL (ref 1.5–4.5)
Glucose: 113 mg/dL — ABNORMAL HIGH (ref 65–99)
Potassium: 4.1 mmol/L (ref 3.5–5.2)
Sodium: 144 mmol/L (ref 134–144)
Total Protein: 6.4 g/dL (ref 6.0–8.5)

## 2017-03-21 LAB — HEMOGLOBIN A1C
Est. average glucose Bld gHb Est-mCnc: 111 mg/dL
HEMOGLOBIN A1C: 5.5 % (ref 4.8–5.6)

## 2017-03-21 LAB — TSH: TSH: 4.02 u[IU]/mL (ref 0.450–4.500)

## 2017-03-21 LAB — LIPID PANEL
CHOLESTEROL TOTAL: 310 mg/dL — AB (ref 100–199)
Chol/HDL Ratio: 7.9 ratio — ABNORMAL HIGH (ref 0.0–4.4)
HDL: 39 mg/dL — ABNORMAL LOW (ref >39)
LDL CALC: 197 mg/dL — AB (ref 0–99)
TRIGLYCERIDES: 369 mg/dL — AB (ref 0–149)
VLDL Cholesterol Cal: 74 mg/dL — ABNORMAL HIGH (ref 5–40)

## 2017-03-21 LAB — VITAMIN D 25 HYDROXY (VIT D DEFICIENCY, FRACTURES): VIT D 25 HYDROXY: 18.6 ng/mL — AB (ref 30.0–100.0)

## 2017-03-21 LAB — VITAMIN B12: Vitamin B-12: 532 pg/mL (ref 232–1245)

## 2017-03-29 ENCOUNTER — Encounter: Payer: Self-pay | Admitting: Adult Health

## 2017-03-29 ENCOUNTER — Ambulatory Visit (INDEPENDENT_AMBULATORY_CARE_PROVIDER_SITE_OTHER): Payer: Medicare Other | Admitting: Adult Health

## 2017-03-29 VITALS — BP 121/76 | HR 89 | Ht 60.5 in | Wt 172.3 lb

## 2017-03-29 DIAGNOSIS — I10 Essential (primary) hypertension: Secondary | ICD-10-CM

## 2017-03-29 DIAGNOSIS — E559 Vitamin D deficiency, unspecified: Secondary | ICD-10-CM | POA: Diagnosis not present

## 2017-03-29 DIAGNOSIS — Z780 Asymptomatic menopausal state: Secondary | ICD-10-CM

## 2017-03-29 DIAGNOSIS — F32A Depression, unspecified: Secondary | ICD-10-CM

## 2017-03-29 DIAGNOSIS — F419 Anxiety disorder, unspecified: Secondary | ICD-10-CM

## 2017-03-29 DIAGNOSIS — Z23 Encounter for immunization: Secondary | ICD-10-CM

## 2017-03-29 DIAGNOSIS — Z1231 Encounter for screening mammogram for malignant neoplasm of breast: Secondary | ICD-10-CM | POA: Diagnosis not present

## 2017-03-29 DIAGNOSIS — F329 Major depressive disorder, single episode, unspecified: Secondary | ICD-10-CM | POA: Diagnosis not present

## 2017-03-29 DIAGNOSIS — E7849 Other hyperlipidemia: Secondary | ICD-10-CM

## 2017-03-29 DIAGNOSIS — Z79899 Other long term (current) drug therapy: Secondary | ICD-10-CM | POA: Diagnosis not present

## 2017-03-29 DIAGNOSIS — Z0000 Encounter for general adult medical examination without abnormal findings: Secondary | ICD-10-CM | POA: Diagnosis not present

## 2017-03-29 DIAGNOSIS — Z1239 Encounter for other screening for malignant neoplasm of breast: Secondary | ICD-10-CM

## 2017-03-29 MED ORDER — ATORVASTATIN CALCIUM 20 MG PO TABS: 29.0000 mg | ORAL_TABLET | Freq: Every day | ORAL | 1 refills | Status: DC

## 2017-03-29 MED ORDER — VITAMIN D (ERGOCALCIFEROL) 1.25 MG (50000 UNIT) PO CAPS: 50000.0000 [IU] | ORAL_CAPSULE | ORAL | 0 refills | Status: DC

## 2017-03-29 MED ORDER — ATORVASTATIN CALCIUM 20 MG PO TABS: 20.0000 mg | ORAL_TABLET | Freq: Every day | ORAL | 1 refills | Status: DC

## 2017-03-29 NOTE — Assessment & Plan Note (Signed)
CBT referral placed Continue Bupropion 150mg  and Venlafaxine 150mg  daily Denies thoughts of harming herself/others.

## 2017-03-29 NOTE — Assessment & Plan Note (Signed)
BP at goal- 121/76, HR 89 Continue amlodipine 2.5mg  daily Follow heart healthy diet

## 2017-03-29 NOTE — Addendum Note (Signed)
Addended by: Fonnie Mu on: 03/29/2017 03:17 PM   Modules accepted: Orders

## 2017-03-29 NOTE — Assessment & Plan Note (Addendum)
Ref Range & Units 9d ago  Cholesterol, Total 100 - 199 mg/dL 310 Abnormally high    Triglycerides 0 - 149 mg/dL 369 Abnormally high    HDL >39 mg/dL 39 Abnormally low    VLDL Cholesterol Cal 5 - 40 mg/dL 74 Abnormally high    LDL Calculated 0 - 99 mg/dL 197 Abnormally high    Chol/HDL Ratio 0.0 - 4.4 ratio 7.9 Abnormally high     ASCVD risk 12.3%, optimal 5.3% Started on Atorvastatin 20mg  daily Re-check ALT 6 weeks and lipids in 4 months. INCREASE WATER INTAKE! Heart healthy diet and increase regular exercise.

## 2017-03-29 NOTE — Patient Instructions (Signed)
Atorvastatin tablets What is this medicine? ATORVASTATIN (a TORE va sta tin) is known as a HMG-CoA reductase inhibitor or 'statin'. It lowers the level of cholesterol and triglycerides in the blood. This drug may also reduce the risk of heart attack, stroke, or other health problems in patients with risk factors for heart disease. Diet and lifestyle changes are often used with this drug. This medicine may be used for other purposes; ask your health care provider or pharmacist if you have questions. COMMON BRAND NAME(S): Lipitor What should I tell my health care provider before I take this medicine? They need to know if you have any of these conditions: -frequently drink alcoholic beverages -history of stroke, TIA -kidney disease -liver disease -muscle aches or weakness -other medical condition -an unusual or allergic reaction to atorvastatin, other medicines, foods, dyes, or preservatives -pregnant or trying to get pregnant -breast-feeding How should I use this medicine? Take this medicine by mouth with a glass of water. Follow the directions on the prescription label. You can take this medicine with or without food. Take your doses at regular intervals. Do not take your medicine more often than directed. Talk to your pediatrician regarding the use of this medicine in children. While this drug may be prescribed for children as young as 44 years old for selected conditions, precautions do apply. Overdosage: If you think you have taken too much of this medicine contact a poison control center or emergency room at once. NOTE: This medicine is only for you. Do not share this medicine with others. What if I miss a dose? If you miss a dose, take it as soon as you can. If it is almost time for your next dose, take only that dose. Do not take double or extra doses. What may interact with this medicine? Do not take this medicine with any of the following medications: -red yeast  rice -telaprevir -telithromycin -voriconazole This medicine may also interact with the following medications: -alcohol -antiviral medicines for HIV or AIDS -boceprevir -certain antibiotics like clarithromycin, erythromycin, troleandomycin -certain medicines for cholesterol like fenofibrate or gemfibrozil -cimetidine -clarithromycin -colchicine -cyclosporine -digoxin -female hormones, like estrogens or progestins and birth control pills -grapefruit juice -medicines for fungal infections like fluconazole, itraconazole, ketoconazole -niacin -rifampin -spironolactone This list may not describe all possible interactions. Give your health care provider a list of all the medicines, herbs, non-prescription drugs, or dietary supplements you use. Also tell them if you smoke, drink alcohol, or use illegal drugs. Some items may interact with your medicine. What should I watch for while using this medicine? Visit your doctor or health care professional for regular check-ups. You may need regular tests to make sure your liver is working properly. Tell your doctor or health care professional right away if you get any unexplained muscle pain, tenderness, or weakness, especially if you also have a fever and tiredness. Your doctor or health care professional may tell you to stop taking this medicine if you develop muscle problems. If your muscle problems do not go away after stopping this medicine, contact your health care professional. This drug is only part of a total heart-health program. Your doctor or a dietician can suggest a low-cholesterol and low-fat diet to help. Avoid alcohol and smoking, and keep a proper exercise schedule. Do not use this drug if you are pregnant or breast-feeding. Serious side effects to an unborn child or to an infant are possible. Talk to your doctor or pharmacist for more information. This medicine may affect  blood sugar levels. If you have diabetes, check with your doctor  or health care professional before you change your diet or the dose of your diabetic medicine. If you are going to have surgery tell your health care professional that you are taking this drug. What side effects may I notice from receiving this medicine? Side effects that you should report to your doctor or health care professional as soon as possible: -allergic reactions like skin rash, itching or hives, swelling of the face, lips, or tongue -dark urine -fever -joint pain -muscle cramps, pain -redness, blistering, peeling or loosening of the skin, including inside the mouth -trouble passing urine or change in the amount of urine -unusually weak or tired -yellowing of eyes or skin Side effects that usually do not require medical attention (report to your doctor or health care professional if they continue or are bothersome): -constipation -heartburn -stomach gas, pain, upset This list may not describe all possible side effects. Call your doctor for medical advice about side effects. You may report side effects to FDA at 1-800-FDA-1088. Where should I keep my medicine? Keep out of the reach of children. Store at room temperature between 20 to 25 degrees C (68 to 77 degrees F). Throw away any unused medicine after the expiration date. NOTE: This sheet is a summary. It may not cover all possible information. If you have questions about this medicine, talk to your doctor, pharmacist, or health care provider.  2018 Elsevier/Gold Standard (2011-03-29 09:18:24)   Persistent Depressive Disorder Persistent depressive disorder (PDD) is a mental health condition. PDD causes symptoms of low-level depression for 2 years or longer. It may also be called long-term (chronic) depression or dysthymia. PDD may include episodes of more severe depression that last for about 2 weeks (major depressive disorder or MDD). PDD can affect the way you think, feel, and sleep. This condition may also affect your  relationships. You may be more likely to get sick if you have PDD. Symptoms of PDD occur for most of the day and may include:  Feeling tired (fatigue).  Low energy.  Eating too much or too little.  Sleeping too much or too little.  Feeling restless or agitated.  Feeling hopeless.  Feeling worthless or guilty.  Feeling worried or nervous (anxiety).  Trouble concentrating or making decisions.  Low self-esteem.  A negative way of looking at things (outlook).  Not being able to have fun or feel pleasure.  Avoiding interacting with people.  Getting angry or annoyed easily (irritability).  Acting aggressive or angry.  Follow these instructions at home: Activity  Go back to your normal activities as told by your doctor.  Exercise regularly as told by your doctor. General instructions  Take over-the-counter and prescription medicines only as told by your doctor.  Do not drink alcohol. Or, limit how much alcohol you drink to no more than 1 drink a day for nonpregnant women and 2 drinks a day for men. One drink equals 12 oz of beer, 5 oz of wine, or 1 oz of hard liquor. Alcohol can affect any antidepressant medicines you are taking. Talk with your doctor about your alcohol use.  Eat a healthy diet and get plenty of sleep.  Find activities that you enjoy each day.  Consider joining a support group. Your doctor may be able to suggest a support group.  Keep all follow-up visits as told by your doctor. This is important. Where to find more information: [employer] on Mental Illness  www.nami.org  [place] National Institute of Mental Health  https://carter.com/  National Suicide Prevention Lifeline  1-800-273-TALK 631-669-2922). This is free, 24-hour help.  Contact a doctor if:  Your symptoms get worse.  You have new symptoms.  You have trouble sleeping or doing your daily activities. Get help right away if:  You self-harm.  You have serious thoughts  about hurting yourself or others.  You see, hear, taste, smell, or feel things that are not there (hallucinate). This information is not intended to replace advice given to you by your health care provider. Make sure you discuss any questions you have with your health care provider. Document Released: 04/20/2015 Document Revised: 01/01/2016 Document Reviewed: 01/01/2016 Elsevier Interactive Patient Education  2017 [place] Anxiety Disorder, Adult Generalized anxiety disorder (GAD) is a mental health disorder. People with this condition constantly worry about everyday events. Unlike normal anxiety, worry related to GAD is not triggered by a specific event. These worries also do not fade or get better with time. GAD interferes with life functions, including relationships, work, and school. GAD can vary from mild to severe. People with severe GAD can have intense waves of anxiety with physical symptoms (panic attacks). What are the causes? The exact cause of GAD is not known. What increases the risk? This condition is more likely to develop in:  Women.  People who have a family history of anxiety disorders.  People who are very shy.  People who experience very stressful life events, such as the death of a loved one.  People who have a very stressful family environment.  What are the signs or symptoms? People with GAD often worry excessively about many things in their lives, such as their health and family. They may also be overly concerned about:  Doing well at work.  Being on time.  Natural disasters.  Friendships.  Physical symptoms of GAD include:  Fatigue.  Muscle tension or having muscle twitches.  Trembling or feeling shaky.  Being easily startled.  Feeling like your heart is pounding or racing.  Feeling out of breath or like you cannot take a deep breath.  Having trouble falling asleep or staying asleep.  Sweating.  Nausea, diarrhea, or  irritable bowel syndrome (IBS).  Headaches.  Trouble concentrating or remembering facts.  Restlessness.  Irritability.  How is this diagnosed? Your health care provider can diagnose GAD based on your symptoms and medical history. You will also have a physical exam. The health care provider will ask specific questions about your symptoms, including how severe they are, when they started, and if they come and go. Your health care provider may ask you about your use of alcohol or drugs, including prescription medicines. Your health care provider may refer you to a mental health specialist for further evaluation. Your health care provider will do a thorough examination and may perform additional tests to rule out other possible causes of your symptoms. To be diagnosed with GAD, a person must have anxiety that:  Is out of his or her control.  Affects several different aspects of his or her life, such as work and relationships.  Causes distress that makes him or her unable to take part in normal activities.  Includes at least three physical symptoms of GAD, such as restlessness, fatigue, trouble concentrating, irritability, muscle tension, or sleep problems.  Before your health care provider can confirm a diagnosis of GAD, these symptoms must be present more days than they are not, and they must last for  six months or longer. How is this treated? The following therapies are usually used to treat GAD:  Medicine. Antidepressant medicine is usually prescribed for long-term daily control. Antianxiety medicines may be added in severe cases, especially when panic attacks occur.  Talk therapy (psychotherapy). Certain types of talk therapy can be helpful in treating GAD by providing support, education, and guidance. Options include: ? Cognitive behavioral therapy (CBT). People learn coping skills and techniques to ease their anxiety. They learn to identify unrealistic or negative thoughts and  behaviors and to replace them with positive ones. ? Acceptance and commitment therapy (ACT). This treatment teaches people how to be mindful as a way to cope with unwanted thoughts and feelings. ? Biofeedback. This process trains you to manage your body's response (physiological response) through breathing techniques and relaxation methods. You will work with a therapist while machines are used to monitor your physical symptoms.  Stress management techniques. These include yoga, meditation, and exercise.  A mental health specialist can help determine which treatment is best for you. Some people see improvement with one type of therapy. However, other people require a combination of therapies. Follow these instructions at home:  Take over-the-counter and prescription medicines only as told by your health care provider.  Try to maintain a normal routine.  Try to anticipate stressful situations and allow extra time to manage them.  Practice any stress management or self-calming techniques as taught by your health care provider.  Do not punish yourself for setbacks or for not making progress.  Try to recognize your accomplishments, even if they are small.  Keep all follow-up visits as told by your health care provider. This is important. Contact a health care provider if:  Your symptoms do not get better.  Your symptoms get worse.  You have signs of depression, such as: ? A persistently sad, cranky, or irritable mood. ? Loss of enjoyment in activities that used to bring you joy. ? Change in weight or eating. ? Changes in sleeping habits. ? Avoiding friends or family members. ? Loss of energy for normal tasks. ? Feelings of guilt or worthlessness. Get help right away if:  You have serious thoughts about hurting yourself or others. If you ever feel like you may hurt yourself or others, or have thoughts about taking your own life, get help right away. You can go to your nearest  emergency department or call:  Your local emergency services (911 in the [place]  A suicide crisis helpline, such as the [place] at 7157067953. This is open 24 hours a day.  Summary  Generalized anxiety disorder (GAD) is a mental health disorder that involves worry that is not triggered by a specific event.  People with GAD often worry excessively about many things in their lives, such as their health and family.  GAD may cause physical symptoms such as restlessness, trouble concentrating, sleep problems, frequent sweating, nausea, diarrhea, headaches, and trembling or muscle twitching.  A mental health specialist can help determine which treatment is best for you. Some people see improvement with one type of therapy. However, other people require a combination of therapies. This information is not intended to replace advice given to you by your health care provider. Make sure you discuss any questions you have with your health care provider. Document Released: 09/03/2012 Document Revised: 03/29/2016 Document Reviewed: 03/29/2016 Elsevier Interactive Patient Education  2018 [employer].  Please start Atorvastatin 20mg  nightly. Please schedule nurse visit for liver function test in  4-6 weeks and follow-up appt in 4 months. Follow heart healthy diet and Increase water intake, strive for at least 80 ounces/day.   Follow Heart Healthy diet Increase regular exercise.  Recommend at least 30 minutes daily, 5 days per week of walking, jogging, biking, swimming, YouTube/Pinterest workout videos. Mental healthy referral placed. NICE TO SEE YOU!

## 2017-03-29 NOTE — Assessment & Plan Note (Addendum)
Vit d level 03/20/17- 18.6 Alkaline Phosphatase 120 Rx vit d for 16 weeks then will re-check level

## 2017-03-29 NOTE — Progress Notes (Signed)
Subjective:    Patient ID: Toni Reyes, female    DOB: 1950/11/03, 66 y.o.   MRN: [id number]  HPI: 12/22/2016 OV Notes: Toni Reyes is here to establish as a new pt.  She is a very pleasant 66 year old female.  PMH:  HTN, depression, HL, generalized fatigue, and notably surgical hx of hemangioma in 2002.  She quit smoking 1986 and overall she feels "pretty healthy".  She is compliant on all medications and denies SE.  She estimates to drink 30-40 ounces water a day and eats only one, large meal usually in the evening.  She is happily retired and hobbies include reading, television, and "looking at things on my phone".   She denies any acute issues/complaints.  Today's OV Notes 03/29/2017: Toni Reyes is here for Medicare Wellness  and review labs. She denies any acute changes/complaints since August OV.  She reports medication compliance and denies SE.  She continues to walk daily with her dog and has been trying to reduce saturated fat/CHO intake. She denies tobacco/ETOH use.  She denies thoughts of harming herself/others and would like to resume CBT with local psychologist.  Patient Care Team    Relationship Specialty Notifications Start End  Esaw Grandchild, NP PCP - General Family Medicine  12/22/16     Patient Active Problem List   Diagnosis Date Noted  . Vitamin D deficiency 03/29/2017  . Hypertension 12/22/2016  . Hyperlipidemia 12/22/2016  . Fatigue 12/22/2016  . Healthcare maintenance 12/22/2016  . Depression 03/10/2015     Past Medical History:  Diagnosis Date  . Depression   . High cholesterol   . Hypertension      Past Surgical History:  Procedure Laterality Date  . BRAIN SURGERY     meningioma  . BREAST SURGERY     augmentation  . CESAREAN SECTION    . CHOLECYSTECTOMY    . CRANIOTOMY    . EYE SURGERY Bilateral    Lasix  . LAMINECTOMY    . TUBAL LIGATION       Family History  Problem Relation Age of Onset  . Emphysema Mother   . Cancer Father    metastatic  . Cancer Sister        lung  . Emphysema Sister   . Healthy Daughter   . Healthy Son   . Cancer Maternal Grandfather        bladder  . Cancer Sister        melanoma  . COPD Sister   . CAD Brother   . Stroke Brother   . Healthy Brother   . Healthy Brother      Social History   Substance and Sexual Activity  Drug Use No     Social History   Substance and Sexual Activity  Alcohol Use Yes   Comment: rarely     Social History   Tobacco Use  Smoking Status Former Smoker  . Packs/day: 2.00  . Years: 26.00  . Pack years: 52.00  . Types: Cigarettes  . Last attempt to quit: 02/22/1985  . Years since quitting: 32.1  Smokeless Tobacco Never Used     Outpatient Encounter Medications as of 03/29/2017  Medication Sig  . amLODipine (NORVASC) 2.5 MG tablet TAKE 1 TABLET BY MOUTH  DAILY  . buPROPion (WELLBUTRIN XL) 150 MG 24 hr tablet Take 1 tablet (150 mg total) by mouth daily.  Marland Kitchen venlafaxine XR (EFFEXOR-XR) 150 MG 24 hr capsule Take 1 capsule (150 mg total)  by mouth daily.  Marland Kitchen atorvastatin (LIPITOR) 20 MG tablet Take 1.5 tablets (30 mg total) daily by mouth.  . Vitamin D, Ergocalciferol, (DRISDOL) 50000 units CAPS capsule Take 1 capsule (50,000 Units total) every 7 (seven) days by mouth.   No facility-administered encounter medications on file as of 03/29/2017.    6CIT Screen 03/29/2017  What Year? 0 points  What month? 0 points  What time? 0 points  Count back from 20 0 points  Months in reverse 0 points  Repeat phrase 2 points  Total Score 2   Fall Risk  11/01/2016 06/22/2016 09/14/2015  Falls in the past year? No No No   Functional Status Survey: Is the patient deaf or have difficulty hearing?: Yes Does the patient have difficulty seeing, even when wearing glasses/contacts?: No Does the patient have difficulty concentrating, remembering, or making decisions?: Yes Does the patient have difficulty walking or climbing stairs?: No Does the patient have  difficulty dressing or bathing?: No Does the patient have difficulty doing errands alone such as visiting a doctor's office or shopping?: No   Allergies: Patient has no known allergies.  Body mass index is 33.1 kg/m.  Blood pressure 121/76, pulse 89, height 5' 0.5" (1.537 m), weight 172 lb 4.8 oz (78.2 kg).     Review of Systems  Constitutional: Positive for fatigue. Negative for activity change, appetite change, chills, diaphoresis, fever and unexpected weight change.  HENT: Negative for congestion.   Eyes: Negative for visual disturbance.  Respiratory: Negative for cough, chest tightness, shortness of breath, wheezing and stridor.   Cardiovascular: Negative for chest pain, palpitations and leg swelling.  Gastrointestinal: Negative for abdominal distention, abdominal pain, blood in stool, constipation, diarrhea, nausea and vomiting.  Endocrine: Negative for cold intolerance, heat intolerance, polydipsia, polyphagia and polyuria.  Genitourinary: Negative for difficulty urinating, flank pain and hematuria.  Musculoskeletal: Positive for arthralgias, back pain and myalgias. Negative for gait problem, joint swelling, neck pain and neck stiffness.       Intermittent R hip pain-none during encounter today.  Skin: Negative for color change, pallor, rash and wound.  Neurological: Negative for dizziness, tremors and weakness.  Hematological: Does not bruise/bleed easily.  Psychiatric/Behavioral: Positive for dysphoric mood. Negative for confusion, decreased concentration, hallucinations, self-injury, sleep disturbance and suicidal ideas. The patient is not nervous/anxious and is not hyperactive.        Objective:   Physical Exam  Constitutional: She is oriented to person, place, and time. She appears well-developed and well-nourished. No distress.  HENT:  Head: Normocephalic and atraumatic.  Right Ear: Hearing, tympanic membrane, external ear and ear canal normal. Tympanic membrane is  not erythematous and not bulging. No decreased hearing is noted.  Left Ear: Hearing, tympanic membrane, external ear and ear canal normal. Tympanic membrane is not erythematous and not bulging. No decreased hearing is noted.  Nose: Nose normal. Right sinus exhibits no maxillary sinus tenderness and no frontal sinus tenderness. Left sinus exhibits no maxillary sinus tenderness and no frontal sinus tenderness.  Mouth/Throat: Uvula is midline, oropharynx is clear and moist and mucous membranes are normal.  Eyes: Conjunctivae are normal. Pupils are equal, round, and reactive to light.  Neck: Normal range of motion. Neck supple.  Cardiovascular: Normal rate, regular rhythm, normal heart sounds and intact distal pulses.  No murmur heard. Pulmonary/Chest: Effort normal and breath sounds normal. No respiratory distress. She has no wheezes. She has no rales. She exhibits no tenderness.  Musculoskeletal: Normal range of motion. She exhibits no  edema or tenderness.  Lymphadenopathy:    She has no cervical adenopathy.  Neurological: She is alert and oriented to person, place, and time.  Skin: Skin is warm and dry. No rash noted. She is not diaphoretic. No erythema. No pallor.  Psychiatric: She has a normal mood and affect. Her behavior is normal. Judgment and thought content normal.  Nursing note and vitals reviewed.         Assessment & Plan:   1. Postmenopausal   2. Need for influenza vaccination   3. Screening for breast cancer   4. Anxiety and depression   5. High risk medication use   6. Other hyperlipidemia   7. Vitamin D deficiency   8. Hypertension, unspecified type   9. Depression, unspecified depression type   10. Healthcare maintenance     Hypertension BP at goal- 121/76, HR 89 Continue amlodipine 2.5mg  daily Follow heart healthy diet  Depression CBT referral placed Continue Bupropion 150mg  and Venlafaxine 150mg  daily Denies thoughts of harming herself/others.  Healthcare  maintenance Please schedule nurse visit for liver function test in 4-6 weeks and follow-up appt in 4 months. Follow heart healthy diet and Increase water intake, strive for at least 80 ounces/day.   Follow Heart Healthy diet Increase regular exercise.  Recommend at least 30 minutes daily, 5 days per week of walking, jogging, biking, swimming, YouTube/Pinterest workout videos. Mental healthy referral placed.  Hyperlipidemia  Ref Range & Units 9d ago  Cholesterol, Total 100 - 199 mg/dL 310 Abnormally high    Triglycerides 0 - 149 mg/dL 369 Abnormally high    HDL >39 mg/dL 39 Abnormally low    VLDL Cholesterol Cal 5 - 40 mg/dL 74 Abnormally high    LDL Calculated 0 - 99 mg/dL 197 Abnormally high    Chol/HDL Ratio 0.0 - 4.4 ratio 7.9 Abnormally high     ASCVD risk 12.3%, optimal 5.3% Started on Atorvastatin 20mg  daily Re-check ALT 6 weeks and lipids in 4 months. INCREASE WATER INTAKE! Heart healthy diet and increase regular exercise.  Vitamin D deficiency Vit d level 03/20/17- 18.6 Alkaline Phosphatase 120 Rx vit d for 16 weeks then will re-check level      FOLLOW-UP:  Return in about 1 year (around 03/29/2018).

## 2017-03-29 NOTE — Assessment & Plan Note (Signed)
Please schedule nurse visit for liver function test in 4-6 weeks and follow-up appt in 4 months. Follow heart healthy diet and Increase water intake, strive for at least 80 ounces/day.   Follow Heart Healthy diet Increase regular exercise.  Recommend at least 30 minutes daily, 5 days per week of walking, jogging, biking, swimming, YouTube/Pinterest workout videos. Mental healthy referral placed.

## 2017-04-06 ENCOUNTER — Other Ambulatory Visit: Payer: Self-pay | Admitting: Adult Health

## 2017-04-06 DIAGNOSIS — E2839 Other primary ovarian failure: Secondary | ICD-10-CM

## 2017-04-14 ENCOUNTER — Other Ambulatory Visit: Payer: Self-pay | Admitting: Physician Assistant

## 2017-04-14 DIAGNOSIS — I10 Essential (primary) hypertension: Secondary | ICD-10-CM

## 2017-05-03 ENCOUNTER — Other Ambulatory Visit: Payer: BLUE CROSS/BLUE SHIELD

## 2017-07-15 ENCOUNTER — Other Ambulatory Visit: Payer: Self-pay | Admitting: Physician Assistant

## 2017-07-15 DIAGNOSIS — I10 Essential (primary) hypertension: Secondary | ICD-10-CM

## 2017-08-28 NOTE — Progress Notes (Signed)
Subjective:    Patient ID: Toni Reyes, female    DOB: April 12, 1951, 67 y.o.   MRN: [id number]  HPI: 12/22/2016 OV Notes: Toni Reyes is here to establish as a new pt.  She is a very pleasant 67 year old female.  PMH:  HTN, depression, HL, generalized fatigue, and notably surgical hx of hemangioma in 2002.  She quit smoking 1986 and overall she feels "pretty healthy".  She is compliant on all medications and denies SE.  She estimates to drink 30-40 ounces water a day and eats only one, large meal usually in the evening.  She is happily retired and hobbies include reading, television, and "looking at things on my phone".   She denies any acute issues/complaints.   03/29/2017 OV: Toni Reyes is here for [facility]  and review labs. She denies any acute changes/complaints since August OV.  She reports medication compliance and denies SE.  She continues to walk daily with her dog and has been trying to reduce saturated fat/CHO intake. She denies tobacco/ETOH use.  She denies thoughts of harming herself/others and would like to resume CBT with local psychologist.  08/30/17 OV: Toni Reyes presents for f/u: HTN, depression, and HLD. Labs obtained today. She feels that her depression is stable and denies thoughts of harming herself/others, but has little interest in activities and does not have many close friends in area. She is concerned about aging "and all the poor health that may come with it".   She prefers to avoid large crowds or engaging with people in public. She plans on increasing daily walking with her dog "Lady" and has been trying to following a heart healthy diet. She is very well groomed today, she had manicure/pedicure yesterday and is going to [employer] after appt this morning.  Depression screen [facility] 2/9 08/30/2017 03/29/2017 11/01/2016  Decreased Interest 3 2 1   Down, Depressed, Hopeless 3 2 1   PHQ - 2 Score 6 4 2   Altered sleeping 3 1 0  Tired, decreased energy 3 2 3   Change in  appetite 3 3 3   Feeling bad or failure about yourself  1 1 1   Trouble concentrating 1 1 3   Moving slowly or fidgety/restless 0 0 3  Suicidal thoughts 1 0 0  PHQ-9 Score 18 12 15   Difficult doing work/chores Somewhat difficult Somewhat difficult Somewhat difficult    Patient Care Team    Relationship Specialty Notifications Start End  Esaw Grandchild, NP PCP - General Family Medicine  12/22/16     Patient Active Problem List   Diagnosis Date Noted  . Vitamin D deficiency 03/29/2017  . Hypertension 12/22/2016  . Hyperlipidemia 12/22/2016  . Fatigue 12/22/2016  . Healthcare maintenance 12/22/2016  . Depression 03/10/2015     Past Medical History:  Diagnosis Date  . Depression   . High cholesterol   . Hypertension      Past Surgical History:  Procedure Laterality Date  . BRAIN SURGERY     meningioma  . BREAST SURGERY     augmentation  . CESAREAN SECTION    . CHOLECYSTECTOMY    . CRANIOTOMY    . EYE SURGERY Bilateral    Lasix  . LAMINECTOMY    . TUBAL LIGATION       Family History  Problem Relation Age of Onset  . Emphysema Mother   . Cancer Father        metastatic  . Cancer Sister        lung  .  Emphysema Sister   . Healthy Daughter   . Healthy Son   . Cancer Maternal Grandfather        bladder  . Cancer Sister        melanoma  . COPD Sister   . CAD Brother   . Stroke Brother   . Healthy Brother   . Healthy Brother      Social History   Substance and Sexual Activity  Drug Use No     Social History   Substance and Sexual Activity  Alcohol Use Yes   Comment: rarely     Social History   Tobacco Use  Smoking Status Former Smoker  . Packs/day: 2.00  . Years: 26.00  . Pack years: 52.00  . Types: Cigarettes  . Last attempt to quit: 02/22/1985  . Years since quitting: 32.5  Smokeless Tobacco Never Used     Outpatient Encounter Medications as of 08/30/2017  Medication Sig  . atorvastatin (LIPITOR) 20 MG tablet Take 1 tablet (20 mg  total) daily by mouth.  Marland Kitchen buPROPion (WELLBUTRIN XL) 150 MG 24 hr tablet Take 1 tablet (150 mg total) by mouth daily.  Marland Kitchen venlafaxine XR (EFFEXOR-XR) 150 MG 24 hr capsule Take 1 capsule (150 mg total) by mouth daily.  Marland Kitchen amLODipine (NORVASC) 2.5 MG tablet Take 1 tablet (2.5 mg total) by mouth daily.  . [DISCONTINUED] amLODipine (NORVASC) 2.5 MG tablet TAKE 1 TABLET BY MOUTH  DAILY  . [DISCONTINUED] Vitamin D, Ergocalciferol, (DRISDOL) 50000 units CAPS capsule Take 1 capsule (50,000 Units total) every 7 (seven) days by mouth.   No facility-administered encounter medications on file as of 08/30/2017.    6CIT Screen 03/29/2017  What Year? 0 points  What month? 0 points  What time? 0 points  Count back from 20 0 points  Months in reverse 0 points  Repeat phrase 2 points  Total Score 2   Fall Risk  11/01/2016 06/22/2016 09/14/2015  Falls in the past year? No No No   Functional Status Survey:     Allergies: Patient has no known allergies.  Body mass index is 34.13 kg/m.  Blood pressure (!) 145/80, pulse 78, height 5' 0.5" (1.537 m), weight 177 lb 11.2 oz (80.6 kg), SpO2 97 %.  Review of Systems  Constitutional: Positive for fatigue. Negative for activity change, appetite change, chills, diaphoresis, fever and unexpected weight change.  HENT: Negative for congestion.   Eyes: Negative for visual disturbance.  Respiratory: Negative for cough, chest tightness, shortness of breath, wheezing and stridor.   Cardiovascular: Negative for chest pain, palpitations and leg swelling.  Gastrointestinal: Negative for abdominal distention, abdominal pain, blood in stool, constipation, diarrhea, nausea and vomiting.  Endocrine: Negative for cold intolerance, heat intolerance, polydipsia, polyphagia and polyuria.  Genitourinary: Negative for difficulty urinating, flank pain and hematuria.  Musculoskeletal: Positive for arthralgias, back pain and myalgias. Negative for gait problem, joint swelling, neck pain  and neck stiffness.  Skin: Negative for color change, pallor, rash and wound.  Neurological: Negative for dizziness, tremors and weakness.  Hematological: Does not bruise/bleed easily.  Psychiatric/Behavioral: Positive for dysphoric mood. Negative for confusion, decreased concentration, hallucinations, self-injury, sleep disturbance and suicidal ideas. The patient is not nervous/anxious and is not hyperactive.        Objective:   Physical Exam  Constitutional: She is oriented to person, place, and time. She appears well-developed and well-nourished. No distress.  HENT:  Head: Normocephalic and atraumatic.  Right Ear: Hearing, tympanic membrane, external ear and ear  canal normal. Tympanic membrane is not erythematous and not bulging. No decreased hearing is noted.  Left Ear: Hearing, tympanic membrane, external ear and ear canal normal. Tympanic membrane is not erythematous and not bulging. No decreased hearing is noted.  Nose: Nose normal. Right sinus exhibits no maxillary sinus tenderness and no frontal sinus tenderness. Left sinus exhibits no maxillary sinus tenderness and no frontal sinus tenderness.  Mouth/Throat: Uvula is midline, oropharynx is clear and moist and mucous membranes are normal.  Eyes: Pupils are equal, round, and reactive to light. Conjunctivae are normal.  Neck: Normal range of motion. Neck supple.  Cardiovascular: Normal rate, regular rhythm, normal heart sounds and intact distal pulses.  No murmur heard. Pulmonary/Chest: Effort normal and breath sounds normal. No respiratory distress. She has no wheezes. She has no rales. She exhibits no tenderness.  Musculoskeletal: Normal range of motion. She exhibits no edema or tenderness.  Lymphadenopathy:    She has no cervical adenopathy.  Neurological: She is alert and oriented to person, place, and time.  Skin: Skin is warm and dry. No rash noted. She is not diaphoretic. No erythema. No pallor.  Psychiatric: She has a normal  mood and affect. Her behavior is normal. Judgment and thought content normal.  Nursing note and vitals reviewed.         Assessment & Plan:   1. Need for pneumococcal vaccination   2. Depression, unspecified depression type   3. Healthcare maintenance   4. Hyperlipidemia, unspecified hyperlipidemia type   5. Vitamin D deficiency   6. High risk medication use   7. Hypertension, unspecified type   8. Essential hypertension     Healthcare maintenance Continue all medications as directed. Increase water intake, strive for at least 80 ounces/day.   Follow Mediterranean diet Increase regular exercise.  Recommend at least 30 minutes daily, 5 days per week of walking, jogging, biking, swimming, YouTube/Pinterest workout videos. Referral to mental health placed. Encourage you to join local book club, [employer], civic group. Please schedule complete physical in 6 months.  Depression Reviewed PHQ results with pt She denies suicidal plans or harming herself/others. She feels isolated, with little pleasure in activities/hobbies. She reports stable mood on Wellbutrin XL 150mg  QD and Venlafaxine 150mg . Referral to mental health placed. Encouraged to join civic activity  Hyperlipidemia Fasting labs and ALT obtained today Currently on atorvastatin 20mg  QD  Hypertension BP 145/80 HR 78 Currently on amlodipine 2.5mg  QD    FOLLOW-UP:  Return in about 6 months (around 03/01/2018) for CPE.

## 2017-08-30 ENCOUNTER — Encounter: Payer: Self-pay | Admitting: Adult Health

## 2017-08-30 ENCOUNTER — Ambulatory Visit (INDEPENDENT_AMBULATORY_CARE_PROVIDER_SITE_OTHER): Payer: Medicare Other | Admitting: Adult Health

## 2017-08-30 ENCOUNTER — Other Ambulatory Visit: Payer: Self-pay | Admitting: Adult Health

## 2017-08-30 VITALS — BP 145/80 | HR 78 | Ht 60.5 in | Wt 177.7 lb

## 2017-08-30 DIAGNOSIS — Z0000 Encounter for general adult medical examination without abnormal findings: Secondary | ICD-10-CM | POA: Diagnosis not present

## 2017-08-30 DIAGNOSIS — E2839 Other primary ovarian failure: Secondary | ICD-10-CM

## 2017-08-30 DIAGNOSIS — I10 Essential (primary) hypertension: Secondary | ICD-10-CM

## 2017-08-30 DIAGNOSIS — Z23 Encounter for immunization: Secondary | ICD-10-CM

## 2017-08-30 DIAGNOSIS — F329 Major depressive disorder, single episode, unspecified: Secondary | ICD-10-CM | POA: Diagnosis not present

## 2017-08-30 DIAGNOSIS — F32A Depression, unspecified: Secondary | ICD-10-CM

## 2017-08-30 DIAGNOSIS — Z79899 Other long term (current) drug therapy: Secondary | ICD-10-CM

## 2017-08-30 DIAGNOSIS — Z1239 Encounter for other screening for malignant neoplasm of breast: Secondary | ICD-10-CM

## 2017-08-30 DIAGNOSIS — E559 Vitamin D deficiency, unspecified: Secondary | ICD-10-CM | POA: Diagnosis not present

## 2017-08-30 DIAGNOSIS — E785 Hyperlipidemia, unspecified: Secondary | ICD-10-CM | POA: Diagnosis not present

## 2017-08-30 MED ORDER — ZOSTER VAC RECOMB ADJUVANTED 50 MCG/0.5ML IM SUSR: 0.5000 mL | Freq: Once | INTRAMUSCULAR | 0 refills | Status: AC

## 2017-08-30 MED ORDER — AMLODIPINE BESYLATE 2.5 MG PO TABS: 2.5000 mg | ORAL_TABLET | Freq: Every day | ORAL | 2 refills | Status: DC

## 2017-08-30 NOTE — Addendum Note (Signed)
Addended by: Fonnie Mu on: 08/30/2017 10:38 AM   Modules accepted: Orders

## 2017-08-30 NOTE — Patient Instructions (Signed)
Mediterranean Diet A Mediterranean diet refers to food and lifestyle choices that are based on the traditions of countries located on the [place]. This way of eating has been shown to help prevent certain conditions and improve outcomes for people who have chronic diseases, like kidney disease and heart disease. What are tips for following this plan? Lifestyle  Cook and eat meals together with your family, when possible.  Drink enough fluid to keep your urine clear or pale yellow.  Be physically active every day. This includes: ? Aerobic exercise like running or swimming. ? Leisure activities like gardening, walking, or housework.  Get 7-8 hours of sleep each night.  If recommended by your health care provider, drink red wine in moderation. This means 1 glass a day for nonpregnant women and 2 glasses a day for men. A glass of wine equals 5 oz (150 mL). Reading food labels  Check the serving size of packaged foods. For foods such as rice and pasta, the serving size refers to the amount of cooked product, not dry.  Check the total fat in packaged foods. Avoid foods that have saturated fat or trans fats.  Check the ingredients list for added sugars, such as corn syrup. Shopping  At the grocery store, buy most of your food from the areas near the walls of the store. This includes: ? Fresh fruits and vegetables (produce). ? Grains, beans, nuts, and seeds. Some of these may be available in unpackaged forms or large amounts (in bulk). ? Fresh seafood. ? Poultry and eggs. ? Low-fat dairy products.  Buy whole ingredients instead of prepackaged foods.  Buy fresh fruits and vegetables in-season from local farmers markets.  Buy frozen fruits and vegetables in resealable bags.  If you do not have access to quality fresh seafood, buy precooked frozen shrimp or canned fish, such as tuna, salmon, or sardines.  Buy small amounts of raw or cooked vegetables, salads, or olives from the  deli or salad bar at your store.  Stock your pantry so you always have certain foods on hand, such as olive oil, canned tuna, canned tomatoes, rice, pasta, and beans. Cooking  Cook foods with extra-virgin olive oil instead of using butter or other vegetable oils.  Have meat as a side dish, and have vegetables or grains as your main dish. This means having meat in small portions or adding small amounts of meat to foods like pasta or stew.  Use beans or vegetables instead of meat in common dishes like chili or lasagna.  Experiment with different cooking methods. Try roasting or broiling vegetables instead of steaming or sauteing them.  Add frozen vegetables to soups, stews, pasta, or rice.  Add nuts or seeds for added healthy fat at each meal. You can add these to yogurt, salads, or vegetable dishes.  Marinate fish or vegetables using olive oil, lemon juice, garlic, and fresh herbs. Meal planning  Plan to eat 1 vegetarian meal one day each week. Try to work up to 2 vegetarian meals, if possible.  Eat seafood 2 or more times a week.  Have healthy snacks readily available, such as: ? Vegetable sticks with hummus. ? Greek yogurt. ? Fruit and nut trail mix.  Eat balanced meals throughout the week. This includes: ? Fruit: 2-3 servings a day ? Vegetables: 4-5 servings a day ? Low-fat dairy: 2 servings a day ? Fish, poultry, or lean meat: 1 serving a day ? Beans and legumes: 2 or more servings a week ? Nuts   and seeds: 1-2 servings a day ? Whole grains: 6-8 servings a day ? Extra-virgin olive oil: 3-4 servings a day  Limit red meat and sweets to only a few servings a month What are my food choices?  Mediterranean diet ? Recommended ? Grains: Whole-grain pasta. Brown rice. Bulgar wheat. Polenta. Couscous. Whole-wheat bread. [place]. ? Vegetables: Artichokes. Beets. Broccoli. Cabbage. Carrots. Eggplant. Green beans. Chard. Kale. Spinach. Onions. Leeks. Peas. Squash.  Tomatoes. Peppers. Radishes. ? Fruits: Apples. Apricots. Avocado. Berries. Bananas. Cherries. Dates. Figs. Grapes. Lemons. Melon. Oranges. Peaches. Plums. Pomegranate. ? Meats and other protein foods: Beans. Almonds. Sunflower seeds. Pine nuts. Peanuts. [place]. Salmon. Scall[place]. Shrimp. [place]. Tilapia. Clams. Oysters. Eggs. ? Dairy: Low-fat milk. Cheese. Greek yogurt. ? Beverages: Water. Red wine. Herbal tea. ? Fats and oils: Extra virgin olive oil. Avocado oil. Grape seed oil. ? Sweets and desserts: [place] yogurt with honey. Baked apples. Poached pears. Trail mix. ? Seasoning and other foods: Basil. Cilantro. Coriander. Cumin. Mint. Parsley. Sage. Rosemary. Tarragon. Garlic. Oregano. Thyme. Pepper. Balsalmic vinegar. Tahini. Hummus. Tomato sauce. Olives. Mushrooms. ? Limit these ? Grains: Prepackaged pasta or rice dishes. Prepackaged cereal with added sugar. ? Vegetables: Deep fried potatoes (french fries). ? Fruits: Fruit canned in syrup. ? Meats and other protein foods: Beef. Pork. Lamb. Poultry with skin. Hot dogs. [place] Salines. ? Dairy: Ice cream. Sour cream. Whole milk. ? Beverages: Juice. Sugar-sweetened soft drinks. Beer. Liquor and spirits. ? Fats and oils: Butter. Canola oil. Vegetable oil. Beef fat (tallow). Lard. ? Sweets and desserts: Cookies. Cakes. Pies. Candy. ? Seasoning and other foods: Mayonnaise. Premade sauces and marinades. ? The items listed may not be a complete list. Talk with your dietitian about what dietary choices are right for you. Summary  The Mediterranean diet includes both food and lifestyle choices.  Eat a variety of fresh fruits and vegetables, beans, nuts, seeds, and whole grains.  Limit the amount of red meat and sweets that you eat.  Talk with your health care provider about whether it is safe for you to drink red wine in moderation. This means 1 glass a day for nonpregnant women and 2 glasses a day for men. A glass of wine equals 5 oz (150 mL). This information  is not intended to replace advice given to you by your health care provider. Make sure you discuss any questions you have with your health care provider. Document Released: 12/31/2015 Document Revised: 02/02/2016 Document Reviewed: 12/31/2015 Elsevier Interactive Patient Education  2018 [employer].    Persistent Depressive Disorder Persistent depressive disorder (PDD) is a mental health condition. PDD causes symptoms of low-level depression for 2 years or longer. It may also be called long-term (chronic) depression or dysthymia. PDD may include episodes of more severe depression that last for about 2 weeks (major depressive disorder or MDD). PDD can affect the way you think, feel, and sleep. This condition may also affect your relationships. You may be more likely to get sick if you have PDD. Symptoms of PDD occur for most of the day and may include:  Feeling tired (fatigue).  Low energy.  Eating too much or too little.  Sleeping too much or too little.  Feeling restless or agitated.  Feeling hopeless.  Feeling worthless or guilty.  Feeling worried or nervous (anxiety).  Trouble concentrating or making decisions.  Low self-esteem.  A negative way of looking at things (outlook).  Not being able to have fun or feel pleasure.  Avoiding interacting with people.  Getting  angry or annoyed easily (irritability).  Acting aggressive or angry.  Follow these instructions at home: Activity  Go back to your normal activities as told by your doctor.  Exercise regularly as told by your doctor. General instructions  Take over-the-counter and prescription medicines only as told by your doctor.  Do not drink alcohol. Or, limit how much alcohol you drink to no more than 1 drink a day for nonpregnant women and 2 drinks a day for men. One drink equals 12 oz of beer, 5 oz of wine, or 1 oz of hard liquor. Alcohol can affect any antidepressant medicines you are taking. Talk with your  doctor about your alcohol use.  Eat a healthy diet and get plenty of sleep.  Find activities that you enjoy each day.  Consider joining a support group. Your doctor may be able to suggest a support group.  Keep all follow-up visits as told by your doctor. This is important. Where to find more information: [employer] on Mental Illness  www.nami.org  U.S. National Institute of Mental Health  https://carter.com/  National Suicide Prevention Lifeline  4175788058 478-319-7594). This is free, 24-hour help.  Contact a doctor if:  Your symptoms get worse.  You have new symptoms.  You have trouble sleeping or doing your daily activities. Get help right away if:  You self-harm.  You have serious thoughts about hurting yourself or others.  You see, hear, taste, smell, or feel things that are not there (hallucinate). This information is not intended to replace advice given to you by your health care provider. Make sure you discuss any questions you have with your health care provider. Document Released: 04/20/2015 Document Revised: 01/01/2016 Document Reviewed: 01/01/2016 Elsevier Interactive Patient Education  2017 [place] all medications as directed. Increase water intake, strive for at least 80 ounces/day.   Follow Mediterranean diet Increase regular exercise.  Recommend at least 30 minutes daily, 5 days per week of walking, jogging, biking, swimming, YouTube/Pinterest workout videos. Referral to mental health placed. Encourage you to join local book club, [employer], civic group. Please schedule complete physical in 6 months. NICE TO SEE YOU!

## 2017-08-30 NOTE — Assessment & Plan Note (Signed)
Continue all medications as directed. Increase water intake, strive for at least 80 ounces/day.   Follow Mediterranean diet Increase regular exercise.  Recommend at least 30 minutes daily, 5 days per week of walking, jogging, biking, swimming, YouTube/Pinterest workout videos. Referral to mental health placed. Encourage you to join local book club, [employer], civic group. Please schedule complete physical in 6 months.

## 2017-08-30 NOTE — Assessment & Plan Note (Signed)
Fasting labs and ALT obtained today Currently on atorvastatin 20mg  QD

## 2017-08-30 NOTE — Assessment & Plan Note (Addendum)
BP 145/80 HR 78 Currently on amlodipine 2.5mg  QD

## 2017-08-30 NOTE — Assessment & Plan Note (Addendum)
Reviewed PHQ results with pt She denies suicidal plans or harming herself/others. She feels isolated, with little pleasure in activities/hobbies. She reports stable mood on Wellbutrin XL 150mg  QD and Venlafaxine 150mg . Referral to mental health placed. Encouraged to join civic activity

## 2017-08-31 ENCOUNTER — Other Ambulatory Visit: Payer: Self-pay | Admitting: Adult Health

## 2017-08-31 DIAGNOSIS — E785 Hyperlipidemia, unspecified: Secondary | ICD-10-CM

## 2017-08-31 DIAGNOSIS — Z79899 Other long term (current) drug therapy: Secondary | ICD-10-CM

## 2017-08-31 LAB — LIPID PANEL
CHOLESTEROL TOTAL: 355 mg/dL — AB (ref 100–199)
Chol/HDL Ratio: 12.7 ratio — ABNORMAL HIGH (ref 0.0–4.4)
HDL: 28 mg/dL — ABNORMAL LOW (ref >39)
Triglycerides: 1320 mg/dL (ref 0–149)

## 2017-08-31 LAB — VITAMIN D 25 HYDROXY (VIT D DEFICIENCY, FRACTURES): Vit D, 25-Hydroxy: 22.4 ng/mL — ABNORMAL LOW (ref 30.0–100.0)

## 2017-08-31 LAB — ALT: ALT: 17 IU/L (ref 0–32)

## 2017-08-31 MED ORDER — ATORVASTATIN CALCIUM 80 MG PO TABS: 80.0000 mg | ORAL_TABLET | Freq: Every day | ORAL | 1 refills | Status: DC

## 2017-09-28 ENCOUNTER — Other Ambulatory Visit: Payer: Self-pay

## 2017-09-28 DIAGNOSIS — Z780 Asymptomatic menopausal state: Secondary | ICD-10-CM

## 2017-10-09 LAB — HM MAMMOGRAPHY

## 2017-10-09 LAB — HM DEXA SCAN

## 2017-10-12 ENCOUNTER — Telehealth: Payer: Self-pay

## 2017-10-12 NOTE — Telephone Encounter (Signed)
Received Dexa scan report.  Toni Reyes asked that pt be informed to continue Vitamin D supplement and add a calcium supplement.  Charyl Bigger, CMA  Pt informed of results.  Pt expressed understanding and is agreeable.  Charyl Bigger, CMA

## 2017-10-20 ENCOUNTER — Other Ambulatory Visit (INDEPENDENT_AMBULATORY_CARE_PROVIDER_SITE_OTHER): Payer: Medicare Other

## 2017-10-20 DIAGNOSIS — Z79899 Other long term (current) drug therapy: Secondary | ICD-10-CM

## 2017-10-20 DIAGNOSIS — E785 Hyperlipidemia, unspecified: Secondary | ICD-10-CM

## 2017-10-21 LAB — LIPID PANEL
Chol/HDL Ratio: 4.7 ratio — ABNORMAL HIGH (ref 0.0–4.4)
Cholesterol, Total: 198 mg/dL (ref 100–199)
HDL: 42 mg/dL (ref >39)
LDL CALC: 111 mg/dL — AB (ref 0–99)
Triglycerides: 223 mg/dL — ABNORMAL HIGH (ref 0–149)
VLDL CHOLESTEROL CAL: 45 mg/dL — AB (ref 5–40)

## 2017-10-21 LAB — ALT: ALT: 19 IU/L (ref 0–32)

## 2017-10-23 ENCOUNTER — Encounter: Payer: Self-pay | Admitting: Adult Health

## 2017-11-08 ENCOUNTER — Ambulatory Visit: Payer: Medicare Other | Admitting: Psychology

## 2017-11-27 ENCOUNTER — Other Ambulatory Visit: Payer: Self-pay | Admitting: Physician Assistant

## 2017-11-27 DIAGNOSIS — F331 Major depressive disorder, recurrent, moderate: Secondary | ICD-10-CM

## 2017-11-28 NOTE — Telephone Encounter (Signed)
venlafaxine refill Last Refill:11/01/16 # 90 3 RF Last OV: 11/01/16 PCP: Philis Fendt PA Pharmacy:[facility] [address]  Bupropion refill Last Refill:11/01/16 # 90 3 RF Last OV: 11/01/16 Both refilled for 1 month. Pt needs office for more refills

## 2017-11-30 ENCOUNTER — Ambulatory Visit: Payer: Medicare Other | Admitting: Psychology

## 2017-12-01 ENCOUNTER — Ambulatory Visit (INDEPENDENT_AMBULATORY_CARE_PROVIDER_SITE_OTHER): Payer: Medicare Other | Admitting: Psychology

## 2017-12-01 DIAGNOSIS — F331 Major depressive disorder, recurrent, moderate: Secondary | ICD-10-CM | POA: Diagnosis not present

## 2017-12-20 ENCOUNTER — Ambulatory Visit: Payer: Medicare Other | Admitting: Psychology

## 2018-01-16 ENCOUNTER — Other Ambulatory Visit: Payer: Self-pay

## 2018-01-16 DIAGNOSIS — F331 Major depressive disorder, recurrent, moderate: Secondary | ICD-10-CM

## 2018-01-16 MED ORDER — BUPROPION HCL ER (XL) 150 MG PO TB24: 150.0000 mg | ORAL_TABLET | Freq: Every day | ORAL | 0 refills | Status: DC

## 2018-01-16 MED ORDER — VENLAFAXINE HCL ER 150 MG PO CP24: 150.0000 mg | ORAL_CAPSULE | Freq: Every day | ORAL | 0 refills | Status: DC

## 2018-01-16 NOTE — Telephone Encounter (Signed)
We have not prescribed these medications for the patient previously.  Please review and refill if appropriate.  T. Nelson, CMA  

## 2018-01-23 NOTE — Progress Notes (Signed)
Subjective:    Patient ID: Toni Reyes, female    DOB: March 05, 1951, 67 y.o.   MRN: [id number]  HPI:  [name] presents with R ear pain and R sided facial swelling/pressure that began >2 weeks ago and has steadily been worsening. She denies nasal drainage, however reports post nasal gtt, and R cervical swollen lymph node. She denies fever or increase in night sweats. She denies N/V/D, or being around anyone acutely ill. She has been using OTC Ibuprofen and OTC Afrin with some sx relief. She also R jaw pain the lat few weeks, however she has been using "facial relaxation techniques" and pain resolved 3 days ago She has hx of bruxism, declined occlusal splint from dentist  Patient Care Team    Relationship Specialty Notifications Start End  Esaw Grandchild, NP PCP - General Family Medicine  12/22/16     Patient Active Problem List   Diagnosis Date Noted  . Right otitis media 01/24/2018  . Vitamin D deficiency 03/29/2017  . Hypertension 12/22/2016  . Hyperlipidemia 12/22/2016  . Fatigue 12/22/2016  . Healthcare maintenance 12/22/2016  . Depression 03/10/2015     Past Medical History:  Diagnosis Date  . Depression   . High cholesterol   . Hypertension      Past Surgical History:  Procedure Laterality Date  . BRAIN SURGERY     meningioma  . BREAST SURGERY     augmentation  . CESAREAN SECTION    . CHOLECYSTECTOMY    . CRANIOTOMY    . EYE SURGERY Bilateral    Lasix  . LAMINECTOMY    . TUBAL LIGATION       Family History  Problem Relation Age of Onset  . Emphysema Mother   . Cancer Father        metastatic  . Cancer Sister        lung  . Emphysema Sister   . Healthy Daughter   . Healthy Son   . Cancer Maternal Grandfather        bladder  . Cancer Sister        melanoma  . COPD Sister   . CAD Brother   . Stroke Brother   . Healthy Brother   . Healthy Brother      Social History   Substance and Sexual Activity  Drug Use No     Social History    Substance and Sexual Activity  Alcohol Use Yes   Comment: rarely     Social History   Tobacco Use  Smoking Status Former Smoker  . Packs/day: 2.00  . Years: 26.00  . Pack years: 52.00  . Types: Cigarettes  . Last attempt to quit: 02/22/1985  . Years since quitting: 32.9  Smokeless Tobacco Never Used     Outpatient Encounter Medications as of 01/24/2018  Medication Sig  . amLODipine (NORVASC) 2.5 MG tablet Take 1 tablet (2.5 mg total) by mouth daily.  Marland Kitchen buPROPion (WELLBUTRIN XL) 150 MG 24 hr tablet Take 1 tablet (150 mg total) by mouth daily.  Marland Kitchen venlafaxine XR (EFFEXOR-XR) 150 MG 24 hr capsule Take 1 capsule (150 mg total) by mouth daily with breakfast.  . [DISCONTINUED] venlafaxine XR (EFFEXOR-XR) 75 MG 24 hr capsule Take 150 mg by mouth daily with breakfast.  . amoxicillin-clavulanate (AUGMENTIN) 875-125 MG tablet Take 1 tablet by mouth 2 (two) times daily.  . [DISCONTINUED] atorvastatin (LIPITOR) 80 MG tablet Take 1 tablet (80 mg total) by mouth daily.  . [DISCONTINUED]  venlafaxine XR (EFFEXOR-XR) 150 MG 24 hr capsule Take 1 capsule (150 mg total) by mouth daily.   No facility-administered encounter medications on file as of 01/24/2018.     Allergies: Patient has no known allergies.  Body mass index is 36.33 kg/m.  Blood pressure (!) 144/82, pulse 76, height 5' (1.524 m), weight 186 lb (84.4 kg), SpO2 97 %.     Review of Systems  Constitutional: Positive for fatigue. Negative for activity change, appetite change, chills, diaphoresis, fever and unexpected weight change.  HENT: Positive for ear pain, facial swelling, postnasal drip, sinus pressure, sore throat and trouble swallowing. Negative for congestion, ear discharge, hearing loss and voice change.   Eyes: Negative for visual disturbance.  Respiratory: Negative for cough, chest tightness, shortness of breath, wheezing and stridor.   Cardiovascular: Negative for chest pain, palpitations and leg swelling.   Gastrointestinal: Negative for abdominal distention, abdominal pain, blood in stool, constipation, diarrhea, nausea and vomiting.  Neurological: Negative for dizziness and headaches.  Hematological: Does not bruise/bleed easily.       Objective:   Physical Exam  Constitutional: She is oriented to person, place, and time. She appears well-developed and well-nourished. No distress.  HENT:  Head: Normocephalic and atraumatic.  Right Ear: External ear normal. There is swelling. Tympanic membrane is erythematous and bulging. No decreased hearing is noted.  Left Ear: External ear normal. Tympanic membrane is bulging. Tympanic membrane is not erythematous. No decreased hearing is noted.  Nose: Mucosal edema and rhinorrhea present. Right sinus exhibits maxillary sinus tenderness. Right sinus exhibits no frontal sinus tenderness. Left sinus exhibits no maxillary sinus tenderness and no frontal sinus tenderness.  Mouth/Throat: Uvula is midline and mucous membranes are normal. Posterior oropharyngeal edema and posterior oropharyngeal erythema present. No oropharyngeal exudate or tonsillar abscesses.  Eyes: Pupils are equal, round, and reactive to light. Conjunctivae and EOM are normal.  Neck: Normal range of motion. Neck supple.  Cardiovascular: Normal rate, regular rhythm, normal heart sounds and intact distal pulses.  No murmur heard. Pulmonary/Chest: Effort normal and breath sounds normal. No stridor. No respiratory distress. She has no wheezes. She has no rales. She exhibits no tenderness.  Lymphadenopathy:    She has cervical adenopathy.  Neurological: She is alert and oriented to person, place, and time.  Skin: Skin is warm and dry. Capillary refill takes less than 2 seconds. No rash noted. She is not diaphoretic. No erythema. No pallor.  Psychiatric: She has a normal mood and affect. Her behavior is normal. Judgment and thought content normal.  Nursing note and vitals reviewed.      Assessment & Plan:   1. Right otitis media, unspecified otitis media type   2. Depression, unspecified depression type     Right otitis media Please take Augmentin twice daily with food, take for 10 days. Increase fluids/rest/vit c-2,000mg /day. Alternate OTC Acetaminophen and Ibuprofen as needed for discomfort. If ear pain does not resolve after you complete antibiotic, then please call clinic.  Depression Venlafaxine dosage corrected in the system- 150mg  once daily, refill sent in to [facility]. She had one appt with psychologist, however the high co-pay will prevent her from returning for further CBT She denies thoughts of harming herself/others     FOLLOW-UP:  Return if symptoms worsen or fail to improve.

## 2018-01-24 ENCOUNTER — Encounter: Payer: Self-pay | Admitting: Adult Health

## 2018-01-24 ENCOUNTER — Ambulatory Visit (INDEPENDENT_AMBULATORY_CARE_PROVIDER_SITE_OTHER): Payer: Medicare Other | Admitting: Adult Health

## 2018-01-24 VITALS — BP 144/82 | HR 76 | Ht 60.0 in | Wt 186.0 lb

## 2018-01-24 DIAGNOSIS — H6691 Otitis media, unspecified, right ear: Secondary | ICD-10-CM | POA: Diagnosis not present

## 2018-01-24 DIAGNOSIS — F32A Depression, unspecified: Secondary | ICD-10-CM

## 2018-01-24 DIAGNOSIS — F329 Major depressive disorder, single episode, unspecified: Secondary | ICD-10-CM

## 2018-01-24 MED ORDER — AMOXICILLIN-POT CLAVULANATE 875-125 MG PO TABS: 1.0000 | ORAL_TABLET | Freq: Two times a day (BID) | ORAL | 0 refills | Status: DC

## 2018-01-24 MED ORDER — VENLAFAXINE HCL ER 150 MG PO CP24: 150.0000 mg | ORAL_CAPSULE | Freq: Every day | ORAL | 2 refills | Status: DC

## 2018-01-24 NOTE — Patient Instructions (Addendum)
Otitis Media, Adult Otitis media occurs when there is inflammation and fluid in the middle ear. Your middle ear is a part of the ear that contains bones for hearing as well as air that helps send sounds to your brain. What are the causes? This condition is caused by a blockage in the eustachian tube. This tube drains fluid from the ear to the back of the nose (nasopharynx). A blockage in this tube can be caused by an object or by swelling (edema) in the tube. Problems that can cause a blockage include:  A cold or other upper respiratory infection.  Allergies.  An irritant, such as tobacco smoke.  Enlarged adenoids. The adenoids are areas of soft tissue located high in the back of the throat, behind the nose and the roof of the mouth.  A mass in the nasopharynx.  Damage to the ear caused by pressure changes (barotrauma). What are the signs or symptoms? Symptoms of this condition include:  Ear pain.  A fever.  Decreased hearing.  A headache.  Tiredness (lethargy).  Fluid leaking from the ear.  Ringing in the ear. How is this diagnosed? This condition is diagnosed with a physical exam. During the exam your health care provider will use an instrument called an otoscope to look into your ear and check for redness, swelling, and fluid. He or she will also ask about your symptoms. Your health care provider may also order tests, such as:  A test to check the movement of the eardrum (pneumatic otoscopy). This test is done by squeezing a small amount of air into the ear.  A test that changes air pressure in the middle ear to check how well the eardrum moves and whether the eustachian tube is working (tympanogram). How is this treated? This condition usually goes away on its own within 3-5 days. But if the condition is caused by a bacteria infection and does not go away own its own, or keeps coming back, your health care provider may:  Prescribe antibiotic medicines to treat the  infection.  Prescribe or recommend medicines to control pain. Follow these instructions at home:  Take over-the-counter and prescription medicines only as told by your health care provider.  If you were prescribed an antibiotic medicine, take it as told by your health care provider. Do not stop taking the antibiotic even if you start to feel better.  Keep all follow-up visits as told by your health care provider. This is important. Contact a health care provider if:  You have bleeding from your nose.  There is a lump on your neck.  You are not getting better in 5 days.  You feel worse instead of better. Get help right away if:  You have severe pain that is not controlled with medicine.  You have swelling, redness, or pain around your ear.  You have stiffness in your neck.  A part of your face is paralyzed.  The bone behind your ear (mastoid) is tender when you touch it.  You develop a severe headache. Summary  Otitis media is redness, soreness, and swelling of the middle ear.  This condition usually goes away on its own within 3-5 days.  If the problem does not go away in 3-5 days, your health care provider may prescribe or recommend medicines to treat your symptoms.  If you were prescribed an antibiotic medicine, take it as told by your health care provider. This information is not intended to replace advice given to you by your   to you by your health care provider. Make sure you discuss any questions you have with your health care provider. Document Released: 02/12/2004 Document Revised: 04/29/2016 Document Reviewed: 04/29/2016 Elsevier Interactive Patient Education  [employer].   Please take Augmentin twice daily with food, take for 10 days. Increase fluids/rest/vit c-2,000mg /day. Alternate OTC Acetaminophen and Ibuprofen as needed for discomfort. Venlafaxine dosage corrected in the system- 150mg  once daily, refill sent in to [facility]. If ear pain does not resolve after  you complete antibiotic, then please call clinic. FEEL BETTER!

## 2018-01-24 NOTE — Assessment & Plan Note (Signed)
Please take Augmentin twice daily with food, take for 10 days. Increase fluids/rest/vit c-2,000mg /day. Alternate OTC Acetaminophen and Ibuprofen as needed for discomfort. If ear pain does not resolve after you complete antibiotic, then please call clinic.

## 2018-01-24 NOTE — Assessment & Plan Note (Addendum)
Venlafaxine dosage corrected in the system- 150mg  once daily, refill sent in to [facility]. She had one appt with psychologist, however the high co-pay will prevent her from returning for further CBT She denies thoughts of harming herself/others

## 2018-02-06 ENCOUNTER — Other Ambulatory Visit (INDEPENDENT_AMBULATORY_CARE_PROVIDER_SITE_OTHER): Payer: Medicare Other

## 2018-02-06 DIAGNOSIS — Z79899 Other long term (current) drug therapy: Secondary | ICD-10-CM

## 2018-02-06 DIAGNOSIS — E785 Hyperlipidemia, unspecified: Secondary | ICD-10-CM

## 2018-02-07 ENCOUNTER — Other Ambulatory Visit: Payer: Self-pay

## 2018-02-07 LAB — COMPREHENSIVE METABOLIC PANEL
A/G RATIO: 1.7 (ref 1.2–2.2)
ALT: 21 IU/L (ref 0–32)
AST: 14 IU/L (ref 0–40)
Albumin: 3.9 g/dL (ref 3.6–4.8)
Alkaline Phosphatase: 110 IU/L (ref 39–117)
BUN/Creatinine Ratio: 18 (ref 12–28)
BUN: 14 mg/dL (ref 8–27)
Bilirubin Total: 0.5 mg/dL (ref 0.0–1.2)
CALCIUM: 9.7 mg/dL (ref 8.7–10.3)
CO2: 23 mmol/L (ref 20–29)
Chloride: 103 mmol/L (ref 96–106)
Creatinine, Ser: 0.79 mg/dL (ref 0.57–1.00)
GFR calc Af Amer: 88 mL/min/{1.73_m2} (ref >59)
GFR, EST NON AFRICAN AMERICAN: 77 mL/min/{1.73_m2} (ref >59)
GLOBULIN, TOTAL: 2.3 g/dL (ref 1.5–4.5)
Glucose: 111 mg/dL — ABNORMAL HIGH (ref 65–99)
POTASSIUM: 4.3 mmol/L (ref 3.5–5.2)
Sodium: 141 mmol/L (ref 134–144)
TOTAL PROTEIN: 6.2 g/dL (ref 6.0–8.5)

## 2018-02-07 LAB — LIPID PANEL
CHOL/HDL RATIO: 8.1 ratio — AB (ref 0.0–4.4)
Cholesterol, Total: 293 mg/dL — ABNORMAL HIGH (ref 100–199)
HDL: 36 mg/dL — AB (ref >39)
Triglycerides: 476 mg/dL — ABNORMAL HIGH (ref 0–149)

## 2018-02-07 MED ORDER — ATORVASTATIN CALCIUM 80 MG PO TABS: 80.0000 mg | ORAL_TABLET | Freq: Every day | ORAL | 1 refills | Status: DC

## 2018-02-07 NOTE — Telephone Encounter (Signed)
Spoke with patient on phone, she stated she was able to pull up lab results on mychart and was able to review them. I asked her if she was still taking the atorvastatin 80mg  and she stated she was never able to fill the medication due to some technical issue at the [facility]. I verified that the correct pharmacy is the [facility] on [address] and that NP Valetta Fuller will refill that medication for her and to start it ASAP. The patient did state that she has made diet modifications that she feels have helped and improved her results as well.

## 2018-03-01 NOTE — Progress Notes (Signed)
Subjective:    Patient ID: Toni Reyes, female    DOB: [birth year], 67 y.o.   MRN: [id number]  HPI:  Toni Reyes is here for CPE She reports medication compliance, denies SE She estimates to drink >48 oz water day and also drink several herbal teas throughout the day She eats a diet high in CHO/sugar and denies regular exercise She denies tobacco/ETOH use She denies acute complaints today   LFTs and A1cchecked today  Healthcare Maintenance: PAP-N/A Mammogram-UTD Colonoscopy-UTD Immunizations-Shingrix rx sent to pharmacy, re: Medicare  Patient Care Team    Relationship Specialty Notifications Start End  Toni Grandchild, NP PCP - General Family Medicine  12/22/16     Patient Active Problem List   Diagnosis Date Noted  . Right otitis media 01/24/2018  . Vitamin D deficiency 03/29/2017  . Hypertension 12/22/2016  . Hyperlipidemia 12/22/2016  . Fatigue 12/22/2016  . Healthcare maintenance 12/22/2016  . Depression 03/10/2015     Past Medical History:  Diagnosis Date  . Depression   . High cholesterol   . Hypertension      Past Surgical History:  Procedure Laterality Date  . BRAIN SURGERY     meningioma  . BREAST SURGERY     augmentation  . CESAREAN SECTION    . CHOLECYSTECTOMY    . CRANIOTOMY    . EYE SURGERY Bilateral    Lasix  . LAMINECTOMY    . TUBAL LIGATION       Family History  Problem Relation Age of Onset  . Emphysema Mother   . Cancer Father        metastatic  . Cancer Sister        lung  . Emphysema Sister   . Healthy Daughter   . Healthy Son   . Cancer Maternal Grandfather        bladder  . Cancer Sister        melanoma  . COPD Sister   . CAD Brother   . Stroke Brother   . Healthy Brother   . Healthy Brother      Social History   Substance and Sexual Activity  Drug Use No     Social History   Substance and Sexual Activity  Alcohol Use Yes   Comment: rarely     Social History   Tobacco Use  Smoking Status Former  Smoker  . Packs/day: 2.00  . Years: 26.00  . Pack years: 52.00  . Types: Cigarettes  . Last attempt to quit: 02/22/1985  . Years since quitting: 33.0  Smokeless Tobacco Never Used     Outpatient Encounter Medications as of 03/05/2018  Medication Sig  . amLODipine (NORVASC) 2.5 MG tablet Take 1 tablet (2.5 mg total) by mouth daily.  Marland Kitchen atorvastatin (LIPITOR) 80 MG tablet Take 1 tablet (80 mg total) by mouth daily.  Marland Kitchen buPROPion (WELLBUTRIN XL) 150 MG 24 hr tablet Take 1 tablet (150 mg total) by mouth daily.  Marland Kitchen venlafaxine XR (EFFEXOR-XR) 150 MG 24 hr capsule Take 1 capsule (150 mg total) by mouth daily with breakfast.  . Zoster Vaccine Adjuvanted [facility]) injection Inject 0.5 mLs into the muscle once for 1 dose.  . [DISCONTINUED] amoxicillin-clavulanate (AUGMENTIN) 875-125 MG tablet Take 1 tablet by mouth 2 (two) times daily.   No facility-administered encounter medications on file as of 03/05/2018.     Allergies: Patient has no known allergies.  Body mass index is 36.05 kg/m.  Blood pressure (!) 145/81, pulse 78, height 5' (  1.524 m), weight 184 lb 9.6 oz (83.7 kg), SpO2 98 %.  Review of Systems  Constitutional: Positive for fatigue. Negative for activity change, appetite change, chills, diaphoresis, fever and unexpected weight change.  HENT: Negative for congestion.   Eyes: Negative for visual disturbance.  Respiratory: Negative for cough, chest tightness, shortness of breath, wheezing and stridor.   Cardiovascular: Negative for chest pain, palpitations and leg swelling.  Gastrointestinal: Negative for abdominal distention, abdominal pain, blood in stool, constipation, diarrhea, nausea, rectal pain and vomiting.  Endocrine: Negative for cold intolerance, heat intolerance, polydipsia, polyphagia and polyuria.  Genitourinary: Negative for difficulty urinating and dysuria.  Musculoskeletal: Positive for arthralgias and myalgias. Negative for back pain, gait problem, joint  swelling, neck pain and neck stiffness.  Skin: Negative for color change, pallor, rash and wound.  Neurological: Negative for dizziness and headaches.  Hematological: Does not bruise/bleed easily.  Psychiatric/Behavioral: Positive for dysphoric mood. Negative for behavioral problems, confusion, decreased concentration, hallucinations, self-injury, sleep disturbance and suicidal ideas. The patient is nervous/anxious. The patient is not hyperactive.        Objective:   Physical Exam  Constitutional: She is oriented to person, place, and time. She appears well-developed and well-nourished. No distress.  HENT:  Head: Normocephalic and atraumatic.  Right Ear: External ear normal. Tympanic membrane is not erythematous and not bulging. No decreased hearing is noted.  Left Ear: External ear normal. Tympanic membrane is not erythematous and not bulging. No decreased hearing is noted.  Nose: Nose normal. No mucosal edema or rhinorrhea. Right sinus exhibits no maxillary sinus tenderness and no frontal sinus tenderness. Left sinus exhibits no maxillary sinus tenderness and no frontal sinus tenderness.  Mouth/Throat: Oropharynx is clear and moist and mucous membranes are normal. She has dentures. Abnormal dentition. No oropharyngeal exudate, posterior oropharyngeal edema, posterior oropharyngeal erythema or tonsillar abscesses. Tonsils are 0 on the left. No tonsillar exudate.  Eyes: Pupils are equal, round, and reactive to light. Conjunctivae and EOM are normal.  Neck: Normal range of motion. Neck supple.  Cardiovascular: Normal rate, regular rhythm, normal heart sounds and intact distal pulses.  Pulmonary/Chest: Effort normal and breath sounds normal. No stridor. No respiratory distress. She has no decreased breath sounds. She has no wheezes. She has no rhonchi. She has no rales. She exhibits no tenderness. Right breast exhibits no inverted nipple, no mass, no nipple discharge, no skin change and no  tenderness. Left breast exhibits no inverted nipple, no mass, no nipple discharge, no skin change and no tenderness.  Bil breast implants   Abdominal: Soft. Bowel sounds are normal. She exhibits no distension and no mass. There is no tenderness. There is no rebound and no guarding. No hernia.  Musculoskeletal: Normal range of motion. She exhibits no edema or tenderness.  Neurological: She is alert and oriented to person, place, and time. Coordination normal.  Skin: Skin is warm and dry. Capillary refill takes less than 2 seconds. No rash noted. She is not diaphoretic. No erythema.  Psychiatric: She has a normal mood and affect. Her behavior is normal. Judgment and thought content normal.  Vitals reviewed.     Assessment & Plan:   1. Need for zoster vaccination   2. Encounter for long-term (current) use of high-risk medication   3. Family history of diabetes mellitus   4. Hypertension, unspecified type   5. Healthcare maintenance   6. Hyperlipidemia, unspecified hyperlipidemia type     Healthcare maintenance Stool cards provided, other mx measures UTD Continue all  medications as directed. Increase water intake and follow Mediterranean Diet. Increase regular exercise.  Recommend at least 30 minutes daily, 5 days per week of walking, jogging, biking, swimming, YouTube/Pinterest workout videos. We will call you when lab results are available. Follow-up in 6 months.  Hyperlipidemia The 10-year ASCVD risk score Mikey Bussing DC Jr., et al., 2013) is: 17.9%   Values used to calculate the score:     Age: 74 years     Sex: Female     Is Non-Hispanic African American: No     Diabetic: No     Tobacco smoker: No     Systolic Blood Pressure: 469 mmHg     Is BP treated: Yes     HDL Cholesterol: 36 mg/dL     Total Cholesterol: 293 mg/dL  LDL-unable to calc Currently on atorvastatin 80mg  QD LFTs checked today Increase reg exercise  Hypertension BP slightly above goal 145/81, HR 78 Continue  amlodipine 2.5mg  QD    FOLLOW-UP:  Return in about 6 months (around 09/04/2018) for Regular Follow Up, Obesity, HTN, Hypercholestermia.

## 2018-03-05 ENCOUNTER — Encounter: Payer: Self-pay | Admitting: Adult Health

## 2018-03-05 ENCOUNTER — Ambulatory Visit (INDEPENDENT_AMBULATORY_CARE_PROVIDER_SITE_OTHER): Payer: Medicare Other | Admitting: Adult Health

## 2018-03-05 VITALS — BP 145/81 | HR 78 | Ht 60.0 in | Wt 184.6 lb

## 2018-03-05 DIAGNOSIS — Z79899 Other long term (current) drug therapy: Secondary | ICD-10-CM

## 2018-03-05 DIAGNOSIS — Z833 Family history of diabetes mellitus: Secondary | ICD-10-CM | POA: Diagnosis not present

## 2018-03-05 DIAGNOSIS — Z0000 Encounter for general adult medical examination without abnormal findings: Secondary | ICD-10-CM

## 2018-03-05 DIAGNOSIS — I10 Essential (primary) hypertension: Secondary | ICD-10-CM

## 2018-03-05 DIAGNOSIS — Z23 Encounter for immunization: Secondary | ICD-10-CM

## 2018-03-05 DIAGNOSIS — E785 Hyperlipidemia, unspecified: Secondary | ICD-10-CM | POA: Diagnosis not present

## 2018-03-05 MED ORDER — ZOSTER VAC RECOMB ADJUVANTED 50 MCG/0.5ML IM SUSR: 0.5000 mL | Freq: Once | INTRAMUSCULAR | 0 refills | Status: AC

## 2018-03-05 NOTE — Patient Instructions (Signed)
Preventive Care for Adults, Female  A healthy lifestyle and preventive care can promote health and wellness. Preventive health guidelines for women include the following key practices.   A routine yearly physical is a good way to check with your health care provider about your health and preventive screening. It is a chance to share any concerns and updates on your health and to receive a thorough exam.   Visit your dentist for a routine exam and preventive care every 6 months. Brush your teeth twice a day and floss once a day. Good oral hygiene prevents tooth decay and gum disease.   The frequency of eye exams is based on your age, health, family medical history, use of contact lenses, and other factors. Follow your health care provider's recommendations for frequency of eye exams.   Eat a healthy diet. Foods like vegetables, fruits, whole grains, low-fat dairy products, and lean protein foods contain the nutrients you need without too many calories. Decrease your intake of foods high in solid fats, added sugars, and salt. Eat the right amount of calories for you.Get information about a proper diet from your health care provider, if necessary.   Regular physical exercise is one of the most important things you can do for your health. Most adults should get at least 150 minutes of moderate-intensity exercise (any activity that increases your heart rate and causes you to sweat) each week. In addition, most adults need muscle-strengthening exercises on 2 or more days a week.   Maintain a healthy weight. The body mass index (BMI) is a screening tool to identify possible weight problems. It provides an estimate of body fat based on height and weight. Your health care provider can find your BMI, and can help you achieve or maintain a healthy weight.For adults 20 years and older:   - A BMI below 18.5 is considered underweight.   - A BMI of 18.5 to 24.9 is normal.   - A BMI of 25 to 29.9 is  considered overweight.   - A BMI of 30 and above is considered obese.   Maintain normal blood lipids and cholesterol levels by exercising and minimizing your intake of trans and saturated fats.  Eat a balanced diet with plenty of fruit and vegetables. Blood tests for lipids and cholesterol should begin at age 20 and be repeated every 5 years minimum.  If your lipid or cholesterol levels are high, you are over 40, or you are at high risk for heart disease, you may need your cholesterol levels checked more frequently.Ongoing high lipid and cholesterol levels should be treated with medicines if diet and exercise are not working.   If you smoke, find out from your health care provider how to quit. If you do not use tobacco, do not start.   Lung cancer screening is recommended for adults aged 55-80 years who are at high risk for developing lung cancer because of a history of smoking. A yearly low-dose CT scan of the lungs is recommended for people who have at least a 30-pack-year history of smoking and are a current smoker or have quit within the past 15 years. A pack year of smoking is smoking an average of 1 pack of cigarettes a day for 1 year (for example: 1 pack a day for 30 years or 2 packs a day for 15 years). Yearly screening should continue until the smoker has stopped smoking for at least 15 years. Yearly screening should be stopped for people who develop a   health problem that would prevent them from having lung cancer treatment.   If you are pregnant, do not drink alcohol. If you are breastfeeding, be very cautious about drinking alcohol. If you are not pregnant and choose to drink alcohol, do not have more than 1 drink per day. One drink is considered to be 12 ounces (355 mL) of beer, 5 ounces (148 mL) of wine, or 1.5 ounces (44 mL) of liquor.   Avoid use of street drugs. Do not share needles with anyone. Ask for help if you need support or instructions about stopping the use of  drugs.   High blood pressure causes heart disease and increases the risk of stroke. Your blood pressure should be checked at least yearly.  Ongoing high blood pressure should be treated with medicines if weight loss and exercise do not work.   If you are 69-55 years old, ask your health care provider if you should take aspirin to prevent strokes.   Diabetes screening involves taking a blood sample to check your fasting blood sugar level. This should be done once every 3 years, after age 38, if you are within normal weight and without risk factors for diabetes. Testing should be considered at a younger age or be carried out more frequently if you are overweight and have at least 1 risk factor for diabetes.   Breast cancer screening is essential preventive care for women. You should practice "breast self-awareness."  This means understanding the normal appearance and feel of your breasts and may include breast self-examination.  Any changes detected, no matter how small, should be reported to a health care provider.  Women in their 80s and 30s should have a clinical breast exam (CBE) by a health care provider as part of a regular health exam every 1 to 3 years.  After age 66, women should have a CBE every year.  Starting at age 1, women should consider having a mammogram (breast X-ray test) every year.  Women who have a family history of breast cancer should talk to their health care provider about genetic screening.  Women at a high risk of breast cancer should talk to their health care providers about having an MRI and a mammogram every year.   -Breast cancer gene (BRCA)-related cancer risk assessment is recommended for women who have family members with BRCA-related cancers. BRCA-related cancers include breast, ovarian, tubal, and peritoneal cancers. Having family members with these cancers may be associated with an increased risk for harmful changes (mutations) in the breast cancer genes BRCA1 and  BRCA2. Results of the assessment will determine the need for genetic counseling and BRCA1 and BRCA2 testing.   The Pap test is a screening test for cervical cancer. A Pap test can show cell changes on the cervix that might become cervical cancer if left untreated. A Pap test is a procedure in which cells are obtained and examined from the lower end of the uterus (cervix).   - Women should have a Pap test starting at age 57.   - Between ages 90 and 70, Pap tests should be repeated every 2 years.   - Beginning at age 63, you should have a Pap test every 3 years as long as the past 3 Pap tests have been normal.   - Some women have medical problems that increase the chance of getting cervical cancer. Talk to your health care provider about these problems. It is especially important to talk to your health care provider if a  new problem develops soon after your last Pap test. In these cases, your health care provider may recommend more frequent screening and Pap tests.   - The above recommendations are the same for women who have or have not gotten the vaccine for human papillomavirus (HPV).   - If you had a hysterectomy for a problem that was not cancer or a condition that could lead to cancer, then you no longer need Pap tests. Even if you no longer need a Pap test, a regular exam is a good idea to make sure no other problems are starting.   - If you are between ages 36 and 66 years, and you have had normal Pap tests going back 10 years, you no longer need Pap tests. Even if you no longer need a Pap test, a regular exam is a good idea to make sure no other problems are starting.   - If you have had past treatment for cervical cancer or a condition that could lead to cancer, you need Pap tests and screening for cancer for at least 20 years after your treatment.   - If Pap tests have been discontinued, risk factors (such as a new sexual partner) need to be reassessed to determine if screening should  be resumed.   - The HPV test is an additional test that may be used for cervical cancer screening. The HPV test looks for the virus that can cause the cell changes on the cervix. The cells collected during the Pap test can be tested for HPV. The HPV test could be used to screen women aged 70 years and older, and should be used in women of any age who have unclear Pap test results. After the age of 67, women should have HPV testing at the same frequency as a Pap test.   Colorectal cancer can be detected and often prevented. Most routine colorectal cancer screening begins at the age of 57 years and continues through age 26 years. However, your health care provider may recommend screening at an earlier age if you have risk factors for colon cancer. On a yearly basis, your health care provider may provide home test kits to check for hidden blood in the stool.  Use of a small camera at the end of a tube, to directly examine the colon (sigmoidoscopy or colonoscopy), can detect the earliest forms of colorectal cancer. Talk to your health care provider about this at age 23, when routine screening begins. Direct exam of the colon should be repeated every 5 -10 years through age 49 years, unless early forms of pre-cancerous polyps or small growths are found.   People who are at an increased risk for hepatitis B should be screened for this virus. You are considered at high risk for hepatitis B if:  -You were born in a country where hepatitis B occurs often. Talk with your health care provider about which countries are considered high risk.  - Your parents were born in a high-risk country and you have not received a shot to protect against hepatitis B (hepatitis B vaccine).  - You have HIV or AIDS.  - You use needles to inject street drugs.  - You live with, or have sex with, someone who has Hepatitis B.  - You get hemodialysis treatment.  - You take certain medicines for conditions like cancer, organ  transplantation, and autoimmune conditions.   Hepatitis C blood testing is recommended for all people born from [birth year] through 1965 and any individual  with known risks for hepatitis C.   Practice safe sex. Use condoms and avoid high-risk sexual practices to reduce the spread of sexually transmitted infections (STIs). STIs include gonorrhea, chlamydia, syphilis, trichomonas, herpes, HPV, and human immunodeficiency virus (HIV). Herpes, HIV, and HPV are viral illnesses that have no cure. They can result in disability, cancer, and death. Sexually active women aged 25 years and younger should be checked for chlamydia. Older women with new or multiple partners should also be tested for chlamydia. Testing for other STIs is recommended if you are sexually active and at increased risk.   Osteoporosis is a disease in which the bones lose minerals and strength with aging. This can result in serious bone fractures or breaks. The risk of osteoporosis can be identified using a bone density scan. Women ages 65 years and over and women at risk for fractures or osteoporosis should discuss screening with their health care providers. Ask your health care provider whether you should take a calcium supplement or vitamin D to There are also several preventive steps women can take to avoid osteoporosis and resulting fractures or to keep osteoporosis from worsening. -->Recommendations include:  Eat a balanced diet high in fruits, vegetables, calcium, and vitamins.  Get enough calcium. The recommended total intake of is 1,200 mg daily; for best absorption, if taking supplements, divide doses into 250-500 mg doses throughout the day. Of the two types of calcium, calcium carbonate is best absorbed when taken with food but calcium citrate can be taken on an empty stomach.  Get enough vitamin D. NAMS and the National Osteoporosis Foundation recommend at least 1,000 IU per day for women age 50 and over who are at risk of vitamin D  deficiency. Vitamin D deficiency can be caused by inadequate sun exposure (for example, those who live in northern latitudes).  Avoid alcohol and smoking. Heavy alcohol intake (more than 7 drinks per week) increases the risk of falls and hip fracture and women smokers tend to lose bone more rapidly and have lower bone mass than nonsmokers. Stopping smoking is one of the most important changes women can make to improve their health and decrease risk for disease.  Be physically active every day. Weight-bearing exercise (for example, fast walking, hiking, jogging, and weight training) may strengthen bones or slow the rate of bone loss that comes with aging. Balancing and muscle-strengthening exercises can reduce the risk of falling and fracture.  Consider therapeutic medications. Currently, several types of effective drugs are available. Healthcare providers can recommend the type most appropriate for each woman.  Eliminate environmental factors that may contribute to accidents. Falls cause nearly 90% of all osteoporotic fractures, so reducing this risk is an important bone-health strategy. Measures include ample lighting, removing obstructions to walking, using nonskid rugs on floors, and placing mats and/or grab bars in showers.  Be aware of medication side effects. Some common medicines make bones weaker. These include a type of steroid drug called glucocorticoids used for arthritis and asthma, some antiseizure drugs, certain sleeping pills, treatments for endometriosis, and some cancer drugs. An overactive thyroid gland or using too much thyroid hormone for an underactive thyroid can also be a problem. If you are taking these medicines, talk to your doctor about what you can do to help protect your bones.reduce the rate of osteoporosis.    Menopause can be associated with physical symptoms and risks. Hormone replacement therapy is available to decrease symptoms and risks. You should talk to your  health care provider   about whether hormone replacement therapy is right for you.   Use sunscreen. Apply sunscreen liberally and repeatedly throughout the day. You should seek shade when your shadow is shorter than you. Protect yourself by wearing long sleeves, pants, a wide-brimmed hat, and sunglasses year round, whenever you are outdoors.   Once a month, do a whole body skin exam, using a mirror to look at the skin on your back. Tell your health care provider of new moles, moles that have irregular borders, moles that are larger than a pencil eraser, or moles that have changed in shape or color.   -Stay current with required vaccines (immunizations).   Influenza vaccine. All adults should be immunized every year.  Tetanus, diphtheria, and acellular pertussis (Td, Tdap) vaccine. Pregnant women should receive 1 dose of Tdap vaccine during each pregnancy. The dose should be obtained regardless of the length of time since the last dose. Immunization is preferred during the 27th 36th week of gestation. An adult who has not previously received Tdap or who does not know her vaccine status should receive 1 dose of Tdap. This initial dose should be followed by tetanus and diphtheria toxoids (Td) booster doses every 10 years. Adults with an unknown or incomplete history of completing a 3-dose immunization series with Td-containing vaccines should begin or complete a primary immunization series including a Tdap dose. Adults should receive a Td booster every 10 years.  Varicella vaccine. An adult without evidence of immunity to varicella should receive 2 doses or a second dose if she has previously received 1 dose. Pregnant females who do not have evidence of immunity should receive the first dose after pregnancy. This first dose should be obtained before leaving the health care facility. The second dose should be obtained 4 8 weeks after the first dose.  Human papillomavirus (HPV) vaccine. Females aged 13 26  years who have not received the vaccine previously should obtain the 3-dose series. The vaccine is not recommended for use in pregnant females. However, pregnancy testing is not needed before receiving a dose. If a female is found to be pregnant after receiving a dose, no treatment is needed. In that case, the remaining doses should be delayed until after the pregnancy. Immunization is recommended for any person with an immunocompromised condition through the age of 26 years if she did not get any or all doses earlier. During the 3-dose series, the second dose should be obtained 4 8 weeks after the first dose. The third dose should be obtained 24 weeks after the first dose and 16 weeks after the second dose.  Zoster vaccine. One dose is recommended for adults aged 60 years or older unless certain conditions are present.  Measles, mumps, and rubella (MMR) vaccine. Adults born before 1957 generally are considered immune to measles and mumps. Adults born in 1957 or later should have 1 or more doses of MMR vaccine unless there is a contraindication to the vaccine or there is laboratory evidence of immunity to each of the three diseases. A routine second dose of MMR vaccine should be obtained at least 28 days after the first dose for students attending postsecondary schools, health care workers, or international travelers. People who received inactivated measles vaccine or an unknown type of measles vaccine during 1963 1967 should receive 2 doses of MMR vaccine. People who received inactivated mumps vaccine or an unknown type of mumps vaccine before 1979 and are at high risk for mumps infection should consider immunization with 2 doses of   MMR vaccine. For females of childbearing age, rubella immunity should be determined. If there is no evidence of immunity, females who are not pregnant should be vaccinated. If there is no evidence of immunity, females who are pregnant should delay immunization until after pregnancy.  Unvaccinated health care workers born before [birth year] who lack laboratory evidence of measles, mumps, or rubella immunity or laboratory confirmation of disease should consider measles and mumps immunization with 2 doses of MMR vaccine or rubella immunization with 1 dose of MMR vaccine.  Pneumococcal 13-valent conjugate (PCV13) vaccine. When indicated, a person who is uncertain of her immunization history and has no record of immunization should receive the PCV13 vaccine. An adult aged 54 years or older who has certain medical conditions and has not been previously immunized should receive 1 dose of PCV13 vaccine. This PCV13 should be followed with a dose of pneumococcal polysaccharide (PPSV23) vaccine. The PPSV23 vaccine dose should be obtained at least 8 weeks after the dose of PCV13 vaccine. An adult aged 58 years or older who has certain medical conditions and previously received 1 or more doses of PPSV23 vaccine should receive 1 dose of PCV13. The PCV13 vaccine dose should be obtained 1 or more years after the last PPSV23 vaccine dose.  Pneumococcal polysaccharide (PPSV23) vaccine. When PCV13 is also indicated, PCV13 should be obtained first. All adults aged 58 years and older should be immunized. An adult younger than age 65 years who has certain medical conditions should be immunized. Any person who resides in a nursing home or long-term care facility should be immunized. An adult smoker should be immunized. People with an immunocompromised condition and certain other conditions should receive both PCV13 and PPSV23 vaccines. People with human immunodeficiency virus (HIV) infection should be immunized as soon as possible after diagnosis. Immunization during chemotherapy or radiation therapy should be avoided. Routine use of PPSV23 vaccine is not recommended for American Indians, [place] Natives, or people younger than 65 years unless there are medical conditions that require PPSV23 vaccine. When indicated,  people who have unknown immunization and have no record of immunization should receive PPSV23 vaccine. One-time revaccination 5 years after the first dose of PPSV23 is recommended for people aged 70 64 years who have chronic kidney failure, nephrotic syndrome, asplenia, or immunocompromised conditions. People who received 1 2 doses of PPSV23 before age 32 years should receive another dose of PPSV23 vaccine at age 96 years or later if at least 5 years have passed since the previous dose. Doses of PPSV23 are not needed for people immunized with PPSV23 at or after age 55 years.  Meningococcal vaccine. Adults with asplenia or persistent complement component deficiencies should receive 2 doses of quadrivalent meningococcal conjugate (MenACWY-D) vaccine. The doses should be obtained at least 2 months apart. Microbiologists working with certain meningococcal bacteria, [employer] recruits, people at risk during an outbreak, and people who travel to or live in countries with a high rate of meningitis should be immunized. A first-year college student up through age 58 years who is living in a residence hall should receive a dose if she did not receive a dose on or after her 16th birthday. Adults who have certain high-risk conditions should receive one or more doses of vaccine.  Hepatitis A vaccine. Adults who wish to be protected from this disease, have certain high-risk conditions, work with hepatitis A-infected animals, work in hepatitis A research labs, or travel to or work in countries with a high rate of hepatitis A should be  immunized. Adults who were previously unvaccinated and who anticipate close contact with an international adoptee during the first 60 days after arrival in the [place] from a country with a high rate of hepatitis A should be immunized.  Hepatitis B vaccine.  Adults who wish to be protected from this disease, have certain high-risk conditions, may be exposed to blood or other infectious  body fluids, are household contacts or sex partners of hepatitis B positive people, are clients or workers in certain care facilities, or travel to or work in countries with a high rate of hepatitis B should be immunized.  Haemophilus influenzae type b (Hib) vaccine. A previously unvaccinated person with asplenia or sickle cell disease or having a scheduled splenectomy should receive 1 dose of Hib vaccine. Regardless of previous immunization, a recipient of a hematopoietic stem cell transplant should receive a 3-dose series 6 12 months after her successful transplant. Hib vaccine is not recommended for adults with HIV infection.  Preventive Services / Frequency Ages 6 to 39years  Blood pressure check.** / Every 1 to 2 years.  Lipid and cholesterol check.** / Every 5 years beginning at age 39.  Clinical breast exam.** / Every 3 years for women in their 61s and 62s.  BRCA-related cancer risk assessment.** / For women who have family members with a BRCA-related cancer (breast, ovarian, tubal, or peritoneal cancers).  Pap test.** / Every 2 years from ages 47 through 85. Every 3 years starting at age 34 through age 12 or 74 with a history of 3 consecutive normal Pap tests.  HPV screening.** / Every 3 years from ages 46 through ages 43 to 54 with a history of 3 consecutive normal Pap tests.  Hepatitis C blood test.** / For any individual with known risks for hepatitis C.  Skin self-exam. / Monthly.  Influenza vaccine. / Every year.  Tetanus, diphtheria, and acellular pertussis (Tdap, Td) vaccine.** / Consult your health care provider. Pregnant women should receive 1 dose of Tdap vaccine during each pregnancy. 1 dose of Td every 10 years.  Varicella vaccine.** / Consult your health care provider. Pregnant females who do not have evidence of immunity should receive the first dose after pregnancy.  HPV vaccine. / 3 doses over 6 months, if 64 and younger. The vaccine is not recommended for use in  pregnant females. However, pregnancy testing is not needed before receiving a dose.  Measles, mumps, rubella (MMR) vaccine.** / You need at least 1 dose of MMR if you were born in 1957 or later. You may also need a 2nd dose. For females of childbearing age, rubella immunity should be determined. If there is no evidence of immunity, females who are not pregnant should be vaccinated. If there is no evidence of immunity, females who are pregnant should delay immunization until after pregnancy.  Pneumococcal 13-valent conjugate (PCV13) vaccine.** / Consult your health care provider.  Pneumococcal polysaccharide (PPSV23) vaccine.** / 1 to 2 doses if you smoke cigarettes or if you have certain conditions.  Meningococcal vaccine.** / 1 dose if you are age 71 to 35 years and a Market researcher living in a residence hall, or have one of several medical conditions, you need to get vaccinated against meningococcal disease. You may also need additional booster doses.  Hepatitis A vaccine.** / Consult your health care provider.  Hepatitis B vaccine.** / Consult your health care provider.  Haemophilus influenzae type b (Hib) vaccine.** / Consult your health care provider.  Ages 55 to 64years  Blood pressure check.** / Every 1 to 2 years.  Lipid and cholesterol check.** / Every 5 years beginning at age 20 years.  Lung cancer screening. / Every year if you are aged 55 80 years and have a 30-pack-year history of smoking and currently smoke or have quit within the past 15 years. Yearly screening is stopped once you have quit smoking for at least 15 years or develop a health problem that would prevent you from having lung cancer treatment.  Clinical breast exam.** / Every year after age 40 years.  BRCA-related cancer risk assessment.** / For women who have family members with a BRCA-related cancer (breast, ovarian, tubal, or peritoneal cancers).  Mammogram.** / Every year beginning at age 40  years and continuing for as long as you are in good health. Consult with your health care provider.  Pap test.** / Every 3 years starting at age 30 years through age 65 or 70 years with a history of 3 consecutive normal Pap tests.  HPV screening.** / Every 3 years from ages 30 years through ages 65 to 70 years with a history of 3 consecutive normal Pap tests.  Fecal occult blood test (FOBT) of stool. / Every year beginning at age 50 years and continuing until age 75 years. You may not need to do this test if you get a colonoscopy every 10 years.  Flexible sigmoidoscopy or colonoscopy.** / Every 5 years for a flexible sigmoidoscopy or every 10 years for a colonoscopy beginning at age 50 years and continuing until age 75 years.  Hepatitis C blood test.** / For all people born from 1945 through 1965 and any individual with known risks for hepatitis C.  Skin self-exam. / Monthly.  Influenza vaccine. / Every year.  Tetanus, diphtheria, and acellular pertussis (Tdap/Td) vaccine.** / Consult your health care provider. Pregnant women should receive 1 dose of Tdap vaccine during each pregnancy. 1 dose of Td every 10 years.  Varicella vaccine.** / Consult your health care provider. Pregnant females who do not have evidence of immunity should receive the first dose after pregnancy.  Zoster vaccine.** / 1 dose for adults aged 60 years or older.  Measles, mumps, rubella (MMR) vaccine.** / You need at least 1 dose of MMR if you were born in 1957 or later. You may also need a 2nd dose. For females of childbearing age, rubella immunity should be determined. If there is no evidence of immunity, females who are not pregnant should be vaccinated. If there is no evidence of immunity, females who are pregnant should delay immunization until after pregnancy.  Pneumococcal 13-valent conjugate (PCV13) vaccine.** / Consult your health care provider.  Pneumococcal polysaccharide (PPSV23) vaccine.** / 1 to 2 doses if  you smoke cigarettes or if you have certain conditions.  Meningococcal vaccine.** / Consult your health care provider.  Hepatitis A vaccine.** / Consult your health care provider.  Hepatitis B vaccine.** / Consult your health care provider.  Haemophilus influenzae type b (Hib) vaccine.** / Consult your health care provider.  Ages 65 years and over  Blood pressure check.** / Every 1 to 2 years.  Lipid and cholesterol check.** / Every 5 years beginning at age 20 years.  Lung cancer screening. / Every year if you are aged 55 80 years and have a 30-pack-year history of smoking and currently smoke or have quit within the past 15 years. Yearly screening is stopped once you have quit smoking for at least 15 years or develop a health problem that   would prevent you from having lung cancer treatment.  Clinical breast exam.** / Every year after age 103 years.  BRCA-related cancer risk assessment.** / For women who have family members with a BRCA-related cancer (breast, ovarian, tubal, or peritoneal cancers).  Mammogram.** / Every year beginning at age 36 years and continuing for as long as you are in good health. Consult with your health care provider.  Pap test.** / Every 3 years starting at age 5 years through age 85 or 10 years with 3 consecutive normal Pap tests. Testing can be stopped between 65 and 70 years with 3 consecutive normal Pap tests and no abnormal Pap or HPV tests in the past 10 years.  HPV screening.** / Every 3 years from ages 93 years through ages 70 or 45 years with a history of 3 consecutive normal Pap tests. Testing can be stopped between 65 and 70 years with 3 consecutive normal Pap tests and no abnormal Pap or HPV tests in the past 10 years.  Fecal occult blood test (FOBT) of stool. / Every year beginning at age 8 years and continuing until age 45 years. You may not need to do this test if you get a colonoscopy every 10 years.  Flexible sigmoidoscopy or colonoscopy.** /  Every 5 years for a flexible sigmoidoscopy or every 10 years for a colonoscopy beginning at age 69 years and continuing until age 68 years.  Hepatitis C blood test.** / For all people born from [birth year] through 1967 and any individual with known risks for hepatitis C.  Osteoporosis screening.** / A one-time screening for women ages 7 years and over and women at risk for fractures or osteoporosis.  Skin self-exam. / Monthly.  Influenza vaccine. / Every year.  Tetanus, diphtheria, and acellular pertussis (Tdap/Td) vaccine.** / 1 dose of Td every 10 years.  Varicella vaccine.** / Consult your health care provider.  Zoster vaccine.** / 1 dose for adults aged 5 years or older.  Pneumococcal 13-valent conjugate (PCV13) vaccine.** / Consult your health care provider.  Pneumococcal polysaccharide (PPSV23) vaccine.** / 1 dose for all adults aged 74 years and older.  Meningococcal vaccine.** / Consult your health care provider.  Hepatitis A vaccine.** / Consult your health care provider.  Hepatitis B vaccine.** / Consult your health care provider.  Haemophilus influenzae type b (Hib) vaccine.** / Consult your health care provider. ** Family history and personal history of risk and conditions may change your health care provider's recommendations. Document Released: 07/05/2001 Document Revised: 02/27/2013  [facility] Patient Information 2014 [place], [place].   EXERCISE AND DIET:  We recommended that you start or continue a regular exercise program for good health. Regular exercise means any activity that makes your heart beat faster and makes you sweat.  We recommend exercising at least 30 minutes per day at least 3 days a week, preferably 5.  We also recommend a diet low in fat and sugar / carbohydrates.  Inactivity, poor dietary choices and obesity can cause diabetes, heart attack, stroke, and kidney damage, among others.     ALCOHOL AND SMOKING:  Women should limit their alcohol intake to no  more than 7 drinks/beers/glasses of wine (combined, not each!) per week. Moderation of alcohol intake to this level decreases your risk of breast cancer and liver damage.  ( And of course, no recreational drugs are part of a healthy lifestyle.)  Also, you should not be smoking at all or even being exposed to second hand smoke. Most people know smoking can  cause cancer, and various heart and lung diseases, but did you know it also contributes to weakening of your bones?  Aging of your skin?  Yellowing of your teeth and nails?   CALCIUM AND VITAMIN D:  Adequate intake of calcium and Vitamin D are recommended.  The recommendations for exact amounts of these supplements seem to change often, but generally speaking 600 mg of calcium (either carbonate or citrate) and 800 units of Vitamin D per day seems prudent. Certain women may benefit from higher intake of Vitamin D.  If you are among these women, your doctor will have told you during your visit.     PAP SMEARS:  Pap smears, to check for cervical cancer or precancers,  have traditionally been done yearly, although recent scientific advances have shown that most women can have pap smears less often.  However, every woman still should have a physical exam from her gynecologist or primary care physician every year. It will include a breast check, inspection of the vulva and vagina to check for abnormal growths or skin changes, a visual exam of the cervix, and then an exam to evaluate the size and shape of the uterus and ovaries.  And after 69 years of age, a rectal exam is indicated to check for rectal cancers. We will also provide age appropriate advice regarding health maintenance, like when you should have certain vaccines, screening for sexually transmitted diseases, bone density testing, colonoscopy, mammograms, etc.    MAMMOGRAMS:  All women over 65 years old should have a yearly mammogram. Many facilities now offer a "3D" mammogram, which may cost  around $50 extra out of pocket. If possible,  we recommend you accept the option to have the 3D mammogram performed.  It both reduces the number of women who will be called back for extra views which then turn out to be normal, and it is better than the routine mammogram at detecting truly abnormal areas.     COLONOSCOPY:  Colonoscopy to screen for colon cancer is recommended for all women at age 69.  We know, you hate the idea of the prep.  We agree, BUT, having colon cancer and not knowing it is worse!!  Colon cancer so often starts as a polyp that can be seen and removed at colonscopy, which can quite literally save your life!  And if your first colonoscopy is normal and you have no family history of colon cancer, most women don't have to have it again for 10 years.  Once every ten years, you can do something that may end up saving your life, right?  We will be happy to help you get it scheduled when you are ready.  Be sure to check your insurance coverage so you understand how much it will cost.  It may be covered as a preventative service at no cost, but you should check your particular policy.     Mediterranean Diet A Mediterranean diet refers to food and lifestyle choices that are based on the traditions of countries located on the [employer]. This way of eating has been shown to help prevent certain conditions and improve outcomes for people who have chronic diseases, like kidney disease and heart disease. What are tips for following this plan? Lifestyle  Cook and eat meals together with your family, when possible.  Drink enough fluid to keep your urine clear or pale yellow.  Be physically active every day. This includes: ? Aerobic exercise like running or swimming. ? Leisure activities  like gardening, walking, or housework.  Get 7-8 hours of sleep each night.  If recommended by your health care provider, drink red wine in moderation. This means 1 glass a day for nonpregnant  women and 2 glasses a day for men. A glass of wine equals 5 oz (150 mL). Reading food labels  Check the serving size of packaged foods. For foods such as rice and pasta, the serving size refers to the amount of cooked product, not dry.  Check the total fat in packaged foods. Avoid foods that have saturated fat or trans fats.  Check the ingredients list for added sugars, such as corn syrup. Shopping  At the grocery store, buy most of your food from the areas near the walls of the store. This includes: ? Fresh fruits and vegetables (produce). ? Grains, beans, nuts, and seeds. Some of these may be available in unpackaged forms or large amounts (in bulk). ? Fresh seafood. ? Poultry and eggs. ? Low-fat dairy products.  Buy whole ingredients instead of prepackaged foods.  Buy fresh fruits and vegetables in-season from local farmers markets.  Buy frozen fruits and vegetables in resealable bags.  If you do not have access to quality fresh seafood, buy precooked frozen shrimp or canned fish, such as tuna, salmon, or sardines.  Buy small amounts of raw or cooked vegetables, salads, or olives from the deli or salad bar at your store.  Stock your pantry so you always have certain foods on hand, such as olive oil, canned tuna, canned tomatoes, rice, pasta, and beans. Cooking  Cook foods with extra-virgin olive oil instead of using butter or other vegetable oils.  Have meat as a side dish, and have vegetables or grains as your main dish. This means having meat in small portions or adding small amounts of meat to foods like pasta or stew.  Use beans or vegetables instead of meat in common dishes like chili or lasagna.  Experiment with different cooking methods. Try roasting or broiling vegetables instead of steaming or sauteing them.  Add frozen vegetables to soups, stews, pasta, or rice.  Add nuts or seeds for added healthy fat at each meal. You can add these to yogurt, salads, or  vegetable dishes.  Marinate fish or vegetables using olive oil, lemon juice, garlic, and fresh herbs. Meal planning  Plan to eat 1 vegetarian meal one day each week. Try to work up to 2 vegetarian meals, if possible.  Eat seafood 2 or more times a week.  Have healthy snacks readily available, such as: ? Vegetable sticks with hummus. ? [place] yogurt. ? Fruit and nut trail mix.  Eat balanced meals throughout the week. This includes: ? Fruit: 2-3 servings a day ? Vegetables: 4-5 servings a day ? Low-fat dairy: 2 servings a day ? Fish, poultry, or lean meat: 1 serving a day ? Beans and legumes: 2 or more servings a week ? Nuts and seeds: 1-2 servings a day ? Whole grains: 6-8 servings a day ? Extra-virgin olive oil: 3-4 servings a day  Limit red meat and sweets to only a few servings a month What are my food choices?  Mediterranean diet ? Recommended ? Grains: Whole-grain pasta. Brown rice. Bulgar wheat. Polenta. Couscous. Whole-wheat bread. [place]. ? Vegetables: Artichokes. Beets. Broccoli. Cabbage. Carrots. Eggplant. Green beans. Chard. Kale. Spinach. Onions. Leeks. Peas. Squash. Tomatoes. Peppers. Radishes. ? Fruits: Apples. Apricots. Avocado. Berries. Bananas. Cherries. Dates. Figs. Grapes. Lemons. Melon. Oranges. Peaches. Plums. Pomegranate. ? Meats and other  protein foods: Beans. Almonds. Sunflower seeds. Pine nuts. Peanuts. [place]. Salmon. Scallops. Shrimp. [place]. Tilapia. Clams. Oysters. Eggs. ? Dairy: Low-fat milk. Cheese. Greek yogurt. ? Beverages: Water. Red wine. Herbal tea. ? Fats and oils: Extra virgin olive oil. Avocado oil. Grape seed oil. ? Sweets and desserts: [place] yogurt with honey. Baked apples. Poached pears. Trail mix. ? Seasoning and other foods: Basil. Cilantro. Coriander. Cumin. Mint. Parsley. Sage. Rosemary. Tarragon. Garlic. Oregano. Thyme. Pepper. Balsalmic vinegar. Tahini. Hummus. Tomato sauce. Olives. Mushrooms. ? Limit these ? Grains: Prepackaged  pasta or rice dishes. Prepackaged cereal with added sugar. ? Vegetables: Deep fried potatoes (french fries). ? Fruits: Fruit canned in syrup. ? Meats and other protein foods: Beef. Pork. Lamb. Poultry with skin. Hot dogs. [place] Salines. ? Dairy: Ice cream. Sour cream. Whole milk. ? Beverages: Juice. Sugar-sweetened soft drinks. Beer. Liquor and spirits. ? Fats and oils: Butter. Canola oil. Vegetable oil. Beef fat (tallow). Lard. ? Sweets and desserts: Cookies. Cakes. Pies. Candy. ? Seasoning and other foods: Mayonnaise. Premade sauces and marinades. ? The items listed may not be a complete list. Talk with your dietitian about what dietary choices are right for you. Summary  The Mediterranean diet includes both food and lifestyle choices.  Eat a variety of fresh fruits and vegetables, beans, nuts, seeds, and whole grains.  Limit the amount of red meat and sweets that you eat.  Talk with your health care provider about whether it is safe for you to drink red wine in moderation. This means 1 glass a day for nonpregnant women and 2 glasses a day for men. A glass of wine equals 5 oz (150 mL). This information is not intended to replace advice given to you by your health care provider. Make sure you discuss any questions you have with your health care provider. Document Released: 12/31/2015 Document Revised: 02/02/2016 Document Reviewed: 12/31/2015 Elsevier Interactive Patient Education  2018 [place] all medications as directed. Increase water intake and follow Mediterranean Diet. Increase regular exercise.  Recommend at least 30 minutes daily, 5 days per week of walking, jogging, biking, swimming, YouTube/Pinterest workout videos. We will call you when lab results are available. Follow-up in 6 months. NICE TO SEE YOU!

## 2018-03-05 NOTE — Assessment & Plan Note (Signed)
The 10-year ASCVD risk score Mikey Bussing DC Brooke Bonito., et al., 2013) is: 17.9%   Values used to calculate the score:     Age: 67 years     Sex: Female     Is Non-Hispanic African American: No     Diabetic: No     Tobacco smoker: No     Systolic Blood Pressure: 979 mmHg     Is BP treated: Yes     HDL Cholesterol: 36 mg/dL     Total Cholesterol: 293 mg/dL  LDL-unable to calc Currently on atorvastatin 80mg  QD LFTs checked today Increase reg exercise

## 2018-03-05 NOTE — Assessment & Plan Note (Signed)
BP slightly above goal 145/81, HR 78 Continue amlodipine 2.5mg  QD

## 2018-03-05 NOTE — Assessment & Plan Note (Addendum)
Stool cards provided, other mx measures UTD Continue all medications as directed. Increase water intake and follow Mediterranean Diet. Increase regular exercise.  Recommend at least 30 minutes daily, 5 days per week of walking, jogging, biking, swimming, YouTube/Pinterest workout videos. We will call you when lab results are available. Follow-up in 6 months.

## 2018-03-06 ENCOUNTER — Other Ambulatory Visit: Payer: Self-pay | Admitting: Adult Health

## 2018-03-06 DIAGNOSIS — F331 Major depressive disorder, recurrent, moderate: Secondary | ICD-10-CM

## 2018-03-06 LAB — HEPATIC FUNCTION PANEL
ALK PHOS: 134 IU/L — AB (ref 39–117)
ALT: 23 IU/L (ref 0–32)
AST: 15 IU/L (ref 0–40)
Albumin: 4.1 g/dL (ref 3.6–4.8)
BILIRUBIN, DIRECT: 0.18 mg/dL (ref 0.00–0.40)
Bilirubin Total: 0.9 mg/dL (ref 0.0–1.2)
Total Protein: 6.6 g/dL (ref 6.0–8.5)

## 2018-03-06 LAB — HEMOGLOBIN A1C
Est. average glucose Bld gHb Est-mCnc: 120 mg/dL
Hgb A1c MFr Bld: 5.8 % — ABNORMAL HIGH (ref 4.8–5.6)

## 2018-07-07 ENCOUNTER — Other Ambulatory Visit: Payer: Self-pay | Admitting: Adult Health

## 2018-07-07 DIAGNOSIS — F331 Major depressive disorder, recurrent, moderate: Secondary | ICD-10-CM

## 2018-07-09 ENCOUNTER — Other Ambulatory Visit: Payer: Self-pay

## 2018-07-12 ENCOUNTER — Other Ambulatory Visit: Payer: Self-pay

## 2018-07-12 MED ORDER — ATORVASTATIN CALCIUM 80 MG PO TABS: 80.0000 mg | ORAL_TABLET | Freq: Every day | ORAL | 1 refills | Status: DC

## 2018-09-07 ENCOUNTER — Other Ambulatory Visit: Payer: Self-pay | Admitting: Adult Health

## 2018-09-07 DIAGNOSIS — I10 Essential (primary) hypertension: Secondary | ICD-10-CM

## 2018-09-09 ENCOUNTER — Other Ambulatory Visit: Payer: Self-pay | Admitting: Adult Health

## 2018-09-09 DIAGNOSIS — F331 Major depressive disorder, recurrent, moderate: Secondary | ICD-10-CM

## 2018-12-10 ENCOUNTER — Other Ambulatory Visit: Payer: Self-pay | Admitting: Adult Health

## 2018-12-10 DIAGNOSIS — F331 Major depressive disorder, recurrent, moderate: Secondary | ICD-10-CM

## 2019-02-27 ENCOUNTER — Other Ambulatory Visit: Payer: Self-pay | Admitting: Adult Health

## 2019-02-27 ENCOUNTER — Other Ambulatory Visit: Payer: Self-pay | Admitting: Family Medicine

## 2019-02-27 DIAGNOSIS — F331 Major depressive disorder, recurrent, moderate: Secondary | ICD-10-CM

## 2019-03-05 NOTE — Progress Notes (Signed)
Subjective:    Patient ID: Toni Reyes, female    DOB: 11/01/1950, 68 y.o.   MRN: [id number]  HPI:03/05/2018 OV:  Toni Reyes is here for CPE She reports medication compliance, denies SE She estimates to drink >48 oz water day and also drink several herbal teas throughout the day She eats a diet high in CHO/sugar and denies regular exercise She denies tobacco/ETOH use She denies acute complaints today   LFTs and A1cchecked today  Healthcare Maintenance: PAP-N/A Mammogram-UTD Colonoscopy-UTD Immunizations-Shingrix rx sent to pharmacy, re: Medicare  03/06/2019 OV:  Toni Reyes is past due for f/u- HTN, HLD, Depression She reports ambulatory BP SBP 140-150s DBP 70-90s She denies acute cardiac sx's She reports sig life stressors- Her granddaughter, her boyfriend and two great grandchildren (girls, ages 69 and 69 months) moved into her home >8 months ago. Arrangement was intended to be temporary. She has given them >2K in funds and she in on fixed income, which has made eating healthy difficult. The granddaughter just recently found work at [employer] and the boyfriend remains unemployed. She reports the grandchildren use marijuana frequently. She reports increasing depression, has had thoughts of suicide- denies any real plans. She denies ETOH or drug use. Their are not any fire arms in home. She has a sister "Pam" that is local that she frequently speaks with. She is agreeable to referral to [facility]- psychiatry and psychology. She made verbal contract with [place] today- she denies SI/HI and if she feels that she is a danger to herself or others that she will seek immediate medical assistance. Depression screen [facility] 2/9 03/06/2019 03/05/2018 01/24/2018 08/30/2017 03/29/2017  Decreased Interest 2 2 3 3 2   Down, Depressed, Hopeless 3 1 2 3 2   PHQ - 2 Score 5 3 5 6 4   Altered sleeping 1 0 3 3 1   Tired, decreased energy 2 3 3 3 2   Change in appetite 2 3 3 3 3   Feeling bad or failure about  yourself  3 3 2 1 1   Trouble concentrating 3 2 3 1 1   Moving slowly or fidgety/restless 0 0 1 0 0  Suicidal thoughts 1 0 0 1 0  PHQ-9 Score 17 14 20 18 12   Difficult doing work/chores Somewhat difficult Very difficult Very difficult Somewhat difficult Somewhat difficult     Patient Care Team    Relationship Specialty Notifications Start End  Esaw Grandchild, NP PCP - General Family Medicine  12/22/16     Patient Active Problem List   Diagnosis Date Noted  . Right otitis media 01/24/2018  . Vitamin D deficiency 03/29/2017  . Hypertension 12/22/2016  . Hyperlipidemia 12/22/2016  . Fatigue 12/22/2016  . Healthcare maintenance 12/22/2016  . Depression 03/10/2015     Past Medical History:  Diagnosis Date  . Depression   . High cholesterol   . Hypertension      Past Surgical History:  Procedure Laterality Date  . BRAIN SURGERY     meningioma  . BREAST SURGERY     augmentation  . CESAREAN SECTION    . CHOLECYSTECTOMY    . CRANIOTOMY    . EYE SURGERY Bilateral    Lasix  . LAMINECTOMY    . TUBAL LIGATION       Family History  Problem Relation Age of Onset  . Emphysema Mother   . Cancer Father        metastatic  . Cancer Sister        lung  .  Emphysema Sister   . Healthy Daughter   . Healthy Son   . Cancer Maternal Grandfather        bladder  . Cancer Sister        melanoma  . COPD Sister   . CAD Brother   . Stroke Brother   . Healthy Brother   . Healthy Brother      Social History   Substance and Sexual Activity  Drug Use No     Social History   Substance and Sexual Activity  Alcohol Use Yes   Comment: rarely     Social History   Tobacco Use  Smoking Status Former Smoker  . Packs/day: 2.00  . Years: 26.00  . Pack years: 52.00  . Types: Cigarettes  . Quit date: 02/22/1985  . Years since quitting: 34.0  Smokeless Tobacco Never Used     Outpatient Encounter Medications as of 03/06/2019  Medication Sig  . atorvastatin (LIPITOR) 80  MG tablet Take 1 tablet (80 mg total) by mouth daily.  Marland Kitchen buPROPion (WELLBUTRIN XL) 150 MG 24 hr tablet TAKE 1 TABLET BY MOUTH  DAILY  . venlafaxine XR (EFFEXOR-XR) 150 MG 24 hr capsule TAKE 1 CAPSULE BY MOUTH  DAILY WITH BREAKFAST  . amLODipine (NORVASC) 2.5 MG tablet TAKE 1 TABLET BY MOUTH  DAILY   No facility-administered encounter medications on file as of 03/06/2019.     Allergies: Patient has no known allergies.  Body mass index is 35.31 kg/m.  Blood pressure (!) 156/86, pulse 75, temperature 98.2 F (36.8 C), temperature source Oral, height 5' (1.524 m), weight 180 lb 12.8 oz (82 kg), SpO2 97 %.  Review of Systems  Constitutional: Positive for fatigue. Negative for activity change, appetite change, chills, diaphoresis, fever and unexpected weight change.  Respiratory: Negative for cough, chest tightness, shortness of breath, wheezing and stridor.   Cardiovascular: Negative for chest pain, palpitations and leg swelling.  Neurological: Negative for dizziness and headaches.  Psychiatric/Behavioral: Positive for dysphoric mood and sleep disturbance. Negative for agitation, behavioral problems, confusion, decreased concentration, hallucinations, self-injury and suicidal ideas. The patient is nervous/anxious. The patient is not hyperactive.        Objective:   Physical Exam Vitals signs and nursing note reviewed.  Constitutional:      General: She is not in acute distress.    Appearance: Normal appearance. She is normal weight. She is not ill-appearing, toxic-appearing or diaphoretic.  Cardiovascular:     Rate and Rhythm: Normal rate and regular rhythm.     Pulses: Normal pulses.     Heart sounds: Normal heart sounds. No murmur. No friction rub. No gallop.   Pulmonary:     Effort: Pulmonary effort is normal. No respiratory distress.     Breath sounds: Normal breath sounds. No stridor. No wheezing, rhonchi or rales.  Chest:     Chest wall: No tenderness.  Skin:    Capillary  Refill: Capillary refill takes less than 2 seconds.  Neurological:     Mental Status: She is alert and oriented to person, place, and time.  Psychiatric:        Attention and Perception: Attention and perception normal.        Mood and Affect: Mood and affect normal. Mood is not anxious or depressed. Affect is not tearful or inappropriate.        Speech: Speech normal.        Behavior: Behavior normal.        Thought Content: Thought  content normal.        Cognition and Memory: Cognition and memory normal.     Comments: Well groomed         Assessment & Plan:   1. Hypertension, unspecified type   2. Hyperlipidemia, unspecified hyperlipidemia type   3. Healthcare maintenance   4. Vitamin D deficiency   5. Fatigue, unspecified type   6. Depression, unspecified depression type     Healthcare maintenance Continue all medications as directed. Urgent referrals placed to psychiatry and psychology- those offices will call you to schedule appt's. If you feel as if your will harm yourself or anyone else- seek immediate medical care. Please communicate to your grandchildren that they are to vacate your home by 15 November- please inform your extended family that this timeline has been set. We will call you with lab results. Please check your blood pressure and heart rate at home- bring log with you to follow-up.  Please follow-up in 4 weeks for physical. Continue to social distance and wear a mask when in public.   Hypertension BP above goal 156/89 Currently on Amlodipine 2.5 mg QD Check BP/HR daily at home, bring log to f/u in 4 weeks    Depression Urgent referrals placed to psychiatry and psychology- those offices will call you to schedule appt's. She made verbal contract with [place] today- she denies SI/HI and if she feels that she is a danger to herself or others that she will seek immediate medical assistance. If you feel as if your will harm yourself or anyone else- seek  immediate medical care. Please communicate to your grandchildren that they are to vacate your home by 15 November- please inform your extended family that this timeline has been set.    FOLLOW-UP:  Return in about 4 weeks (around 04/03/2019) for CPE.

## 2019-03-06 ENCOUNTER — Other Ambulatory Visit: Payer: Self-pay

## 2019-03-06 ENCOUNTER — Ambulatory Visit (INDEPENDENT_AMBULATORY_CARE_PROVIDER_SITE_OTHER): Payer: Medicare Other | Admitting: Adult Health

## 2019-03-06 ENCOUNTER — Encounter: Payer: Self-pay | Admitting: Adult Health

## 2019-03-06 VITALS — BP 156/86 | HR 75 | Temp 98.2°F | Ht 60.0 in | Wt 180.8 lb

## 2019-03-06 DIAGNOSIS — E785 Hyperlipidemia, unspecified: Secondary | ICD-10-CM

## 2019-03-06 DIAGNOSIS — Z0000 Encounter for general adult medical examination without abnormal findings: Secondary | ICD-10-CM

## 2019-03-06 DIAGNOSIS — F32A Depression, unspecified: Secondary | ICD-10-CM

## 2019-03-06 DIAGNOSIS — I10 Essential (primary) hypertension: Secondary | ICD-10-CM | POA: Diagnosis not present

## 2019-03-06 DIAGNOSIS — R5383 Other fatigue: Secondary | ICD-10-CM

## 2019-03-06 DIAGNOSIS — F329 Major depressive disorder, single episode, unspecified: Secondary | ICD-10-CM

## 2019-03-06 DIAGNOSIS — E559 Vitamin D deficiency, unspecified: Secondary | ICD-10-CM

## 2019-03-06 NOTE — Assessment & Plan Note (Signed)
Continue all medications as directed. Urgent referrals placed to psychiatry and psychology- those offices will call you to schedule appt's. If you feel as if your will harm yourself or anyone else- seek immediate medical care. Please communicate to your grandchildren that they are to vacate your home by 15 November- please inform your extended family that this timeline has been set. We will call you with lab results. Please check your blood pressure and heart rate at home- bring log with you to follow-up.  Please follow-up in 4 weeks for physical. Continue to social distance and wear a mask when in public.

## 2019-03-06 NOTE — Assessment & Plan Note (Addendum)
Urgent referrals placed to psychiatry and psychology- those offices will call you to schedule appt's. She made verbal contract with [place] today- she denies SI/HI and if she feels that she is a danger to herself or others that she will seek immediate medical assistance. If you feel as if your will harm yourself or anyone else- seek immediate medical care. Please communicate to your grandchildren that they are to vacate your home by 15 November- please inform your extended family that this timeline has been set.

## 2019-03-06 NOTE — Patient Instructions (Addendum)
Major Depressive Disorder, Adult Major depressive disorder (MDD) is a mental health condition. MDD often makes you feel sad, hopeless, or helpless. MDD can also cause symptoms in your body. MDD can affect your:  Work.  School.  Relationships.  Other normal activities. MDD can range from mild to very bad. It may occur once (single episode MDD). It can also occur many times (recurrent MDD). The main symptoms of MDD often include:  Feeling sad, depressed, or irritable most of the time.  Loss of interest. MDD symptoms also include:  Sleeping too much or too little.  Eating too much or too little.  A change in your weight.  Feeling tired (fatigue) or having low energy.  Feeling worthless.  Feeling guilty.  Trouble making decisions.  Trouble thinking clearly.  Thoughts of suicide or harming others.  Feeling weak.  Feeling agitated.  Keeping yourself from being around other people (isolation). Follow these instructions at home: Activity  Do these things as told by your doctor: ? Go back to your normal activities. ? Exercise regularly. ? Spend time outdoors. Alcohol  Talk with your doctor about how alcohol can affect your antidepressant medicines.  Do not drink alcohol. Or, limit how much alcohol you drink. ? This means no more than 1 drink a day for nonpregnant women and 2 drinks a day for men. One drink equals one of these:  12 oz of beer.  5 oz of wine.  1 oz of hard liquor. General instructions  Take over-the-counter and prescription medicines only as told by your doctor.  Eat a healthy diet.  Get plenty of sleep.  Find activities that you enjoy. Make time to do them.  Think about joining a support group. Your doctor may be able to suggest a group for you.  Keep all follow-up visits as told by your doctor. This is important. Where to find more information:  [employer] on Mental Illness: ? www.nami.[place]: ? https://carter.com/  National Suicide Prevention Lifeline: ? 989 560 3796. This is free, 24-hour help. Contact a doctor if:  Your symptoms get worse.  You have new symptoms. Get help right away if:  You self-harm.  You see, hear, taste, smell, or feel things that are not present (hallucinate). If you ever feel like you may hurt yourself or others, or have thoughts about taking your own life, get help right away. You can go to your nearest emergency department or call:  Your local emergency services (911 in the [place]  A suicide crisis helpline, such as the National Suicide Prevention Lifeline: ? (574)294-7887. This is open 24 hours a day. This information is not intended to replace advice given to you by your health care provider. Make sure you discuss any questions you have with your health care provider. Document Released: 04/20/2015 Document Revised: 04/21/2017 Document Reviewed: 01/24/2016 Elsevier Patient Education  2020 [place] all medications as directed. Urgent referrals placed to psychiatry and psychology- those offices will call you to schedule appt's. If you feel as if your will harm yourself or anyone else- seek immediate medical care. Please communicate to your grandchildren that they are to vacate your home by 15 November- please inform your extended family that this timeline has been set. We will call you with lab results. Please check your blood pressure and heart rate at home- bring log with you to follow-up.  Please follow-up in 4 weeks for physical. Continue to social distance and wear a mask  when in public.

## 2019-03-06 NOTE — Assessment & Plan Note (Signed)
BP above goal 156/89 Currently on Amlodipine 2.5 mg QD Check BP/HR daily at home, bring log to f/u in 4 weeks

## 2019-03-07 LAB — LIPID PANEL
Chol/HDL Ratio: 8.4 ratio — ABNORMAL HIGH (ref 0.0–4.4)
Cholesterol, Total: 326 mg/dL — ABNORMAL HIGH (ref 100–199)
HDL: 39 mg/dL — ABNORMAL LOW (ref >39)
LDL Chol Calc (NIH): 214 mg/dL — ABNORMAL HIGH (ref 0–99)
Triglycerides: 349 mg/dL — ABNORMAL HIGH (ref 0–149)
VLDL Cholesterol Cal: 73 mg/dL — ABNORMAL HIGH (ref 5–40)

## 2019-03-07 LAB — COMPREHENSIVE METABOLIC PANEL
ALT: 29 IU/L (ref 0–32)
AST: 23 IU/L (ref 0–40)
Albumin/Globulin Ratio: 1.6 (ref 1.2–2.2)
Albumin: 4.2 g/dL (ref 3.8–4.8)
Alkaline Phosphatase: 123 IU/L — ABNORMAL HIGH (ref 39–117)
BUN/Creatinine Ratio: 20 (ref 12–28)
BUN: 16 mg/dL (ref 8–27)
Bilirubin Total: 0.6 mg/dL (ref 0.0–1.2)
CO2: 22 mmol/L (ref 20–29)
Calcium: 8.8 mg/dL (ref 8.7–10.3)
Chloride: 107 mmol/L — ABNORMAL HIGH (ref 96–106)
Creatinine, Ser: 0.82 mg/dL (ref 0.57–1.00)
GFR calc Af Amer: 84 mL/min/{1.73_m2} (ref >59)
GFR calc non Af Amer: 73 mL/min/{1.73_m2} (ref >59)
Globulin, Total: 2.6 g/dL (ref 1.5–4.5)
Glucose: 101 mg/dL — ABNORMAL HIGH (ref 65–99)
Potassium: 4.2 mmol/L (ref 3.5–5.2)
Sodium: 142 mmol/L (ref 134–144)
Total Protein: 6.8 g/dL (ref 6.0–8.5)

## 2019-03-07 LAB — CBC WITH DIFFERENTIAL/PLATELET
Basophils Absolute: 0.1 10*3/uL (ref 0.0–0.2)
Basos: 1 %
EOS (ABSOLUTE): 0.1 10*3/uL (ref 0.0–0.4)
Eos: 1 %
Hematocrit: 43.3 % (ref 34.0–46.6)
Hemoglobin: 14.9 g/dL (ref 11.1–15.9)
Immature Grans (Abs): 0 10*3/uL (ref 0.0–0.1)
Immature Granulocytes: 0 %
Lymphocytes Absolute: 3.3 10*3/uL — ABNORMAL HIGH (ref 0.7–3.1)
Lymphs: 43 %
MCH: 29.8 pg (ref 26.6–33.0)
MCHC: 34.4 g/dL (ref 31.5–35.7)
MCV: 87 fL (ref 79–97)
Monocytes Absolute: 0.5 10*3/uL (ref 0.1–0.9)
Monocytes: 7 %
Neutrophils Absolute: 3.7 10*3/uL (ref 1.4–7.0)
Neutrophils: 48 %
Platelets: 261 10*3/uL (ref 150–450)
RBC: 5 x10E6/uL (ref 3.77–5.28)
RDW: 13.9 % (ref 11.7–15.4)
WBC: 7.7 10*3/uL (ref 3.4–10.8)

## 2019-03-07 LAB — HEMOGLOBIN A1C
Est. average glucose Bld gHb Est-mCnc: 117 mg/dL
Hgb A1c MFr Bld: 5.7 % — ABNORMAL HIGH (ref 4.8–5.6)

## 2019-03-07 LAB — VITAMIN D 25 HYDROXY (VIT D DEFICIENCY, FRACTURES): Vit D, 25-Hydroxy: 14.4 ng/mL — ABNORMAL LOW (ref 30.0–100.0)

## 2019-03-07 LAB — TSH: TSH: 3.72 u[IU]/mL (ref 0.450–4.500)

## 2019-03-08 ENCOUNTER — Encounter: Payer: Self-pay | Admitting: Adult Health

## 2019-03-08 ENCOUNTER — Other Ambulatory Visit: Payer: Self-pay | Admitting: Adult Health

## 2019-03-08 DIAGNOSIS — E559 Vitamin D deficiency, unspecified: Secondary | ICD-10-CM

## 2019-03-08 MED ORDER — VITAMIN D (ERGOCALCIFEROL) 1.25 MG (50000 UNIT) PO CAPS: 50000.0000 [IU] | ORAL_CAPSULE | ORAL | 0 refills | Status: AC

## 2019-03-21 ENCOUNTER — Ambulatory Visit (INDEPENDENT_AMBULATORY_CARE_PROVIDER_SITE_OTHER): Payer: Medicare Other | Admitting: Psychology

## 2019-03-21 DIAGNOSIS — F332 Major depressive disorder, recurrent severe without psychotic features: Secondary | ICD-10-CM | POA: Diagnosis not present

## 2019-03-27 ENCOUNTER — Ambulatory Visit: Payer: Medicare Other | Admitting: Psychology

## 2019-04-01 ENCOUNTER — Other Ambulatory Visit: Payer: Self-pay | Admitting: Adult Health

## 2019-04-02 ENCOUNTER — Ambulatory Visit (INDEPENDENT_AMBULATORY_CARE_PROVIDER_SITE_OTHER): Payer: Medicare Other | Admitting: Psychology

## 2019-04-02 DIAGNOSIS — F332 Major depressive disorder, recurrent severe without psychotic features: Secondary | ICD-10-CM

## 2019-04-05 NOTE — Progress Notes (Signed)
derma  Subjective:    Patient ID: Toni Reyes, female    DOB: 1950-11-24, 68 y.o.   MRN: [id number]  HPI:  Toni Reyes is here for CPE She reports taking her statin, anti-hypertensive  She continues to abstain from tobacco/vape use She has not been exercising and eating "lots of [place] and eating out a lot more than usual". Her sister had MI at age 69- sister was tobacco user at time of MI. Pt quit tobacco use 1986.  03/06/2019 Labs: Your Vit D was low- started on ergocalciferol  Your A1c (average of you blood sugar over the last 3 months) is 5.7- in the pre-diabetic range- try to reduce your sugar and carbohydrate intake and increase daily walking to help reduce this number.  Lipid Panel- Total-326 TGs-349 HDL-39 LDL-214-sig increase  Will change from atorvastatin 80mg  to Rosuvastatin Will add Zetia Refer to Cards/Lipid clinic   You also need to dramatically reduce saturated fat. Start walking daily- at least 15 mins  Healthcare Maintenance: PAP-N/A, aged out [place] schedule Spring 2020 Colonoscopy-UTD per pt-tracking down  Immunizations-UTD  Patient Care Team    Relationship Specialty Notifications Start End  Esaw Grandchild, NP PCP - General Family Medicine  12/22/16     Patient Active Problem List   Diagnosis Date Noted  . Right otitis media 01/24/2018  . Vitamin D deficiency 03/29/2017  . Hypertension 12/22/2016  . Hyperlipidemia 12/22/2016  . Fatigue 12/22/2016  . Healthcare maintenance 12/22/2016  . Depression 03/10/2015     Past Medical History:  Diagnosis Date  . Depression   . High cholesterol   . Hypertension      Past Surgical History:  Procedure Laterality Date  . BRAIN SURGERY     meningioma  . BREAST SURGERY     augmentation  . CESAREAN SECTION    . CHOLECYSTECTOMY    . CRANIOTOMY    . EYE SURGERY Bilateral    Lasix  . LAMINECTOMY    . TUBAL LIGATION       Family History  Problem Relation Age of Onset  . Emphysema Mother    . Cancer Father        metastatic  . Cancer Sister        lung  . Emphysema Sister   . Healthy Daughter   . Healthy Son   . Cancer Maternal Grandfather        bladder  . Cancer Sister        melanoma  . COPD Sister   . CAD Brother   . Stroke Brother   . Healthy Brother   . Healthy Brother      Social History   Substance and Sexual Activity  Drug Use No     Social History   Substance and Sexual Activity  Alcohol Use Yes   Comment: rarely     Social History   Tobacco Use  Smoking Status Former Smoker  . Packs/day: 2.00  . Years: 26.00  . Pack years: 52.00  . Types: Cigarettes  . Quit date: 02/22/1985  . Years since quitting: 34.1  Smokeless Tobacco Never Used     Outpatient Encounter Medications as of 04/08/2019  Medication Sig  . buPROPion (WELLBUTRIN XL) 150 MG 24 hr tablet TAKE 1 TABLET BY MOUTH  DAILY  . venlafaxine XR (EFFEXOR-XR) 150 MG 24 hr capsule TAKE 1 CAPSULE BY MOUTH  DAILY WITH BREAKFAST  . Vitamin D, Ergocalciferol, (DRISDOL) 1.25 MG (50000 UT) CAPS capsule Take 1 capsule (  50,000 Units total) by mouth every 7 (seven) days.  . [DISCONTINUED] atorvastatin (LIPITOR) 80 MG tablet Take 1 tablet (80 mg total) by mouth daily.  Marland Kitchen amLODipine (NORVASC) 2.5 MG tablet TAKE 1 TABLET BY MOUTH  DAILY  . ezetimibe (ZETIA) 10 MG tablet Take 1 tablet (10 mg total) by mouth daily.  . rosuvastatin (CRESTOR) 40 MG tablet Take 1 tablet (40 mg total) by mouth daily.  Marland Kitchen Zoster Vaccine Adjuvanted [facility]) injection Inject 0.5 mLs into the muscle once for 1 dose.   No facility-administered encounter medications on file as of 04/08/2019.     Allergies: Patient has no known allergies.  Body mass index is 34.79 kg/m.  Blood pressure 131/76, pulse (!) 101, temperature 99 F (37.2 C), temperature source Oral, height 5' 0.75" (1.543 m), weight 182 lb 9.6 oz (82.8 kg), SpO2 96 %.     Review of Systems  Constitutional: Positive for fatigue. Negative for  activity change, appetite change, chills, diaphoresis, fever and unexpected weight change.  HENT: Negative for congestion.   Eyes: Negative for visual disturbance.  Respiratory: Negative for cough, chest tightness, shortness of breath, wheezing and stridor.   Cardiovascular: Negative for chest pain, palpitations and leg swelling.  Gastrointestinal: Negative for abdominal distention, abdominal pain, anal bleeding, blood in stool, constipation, diarrhea, nausea and vomiting.  Endocrine: Negative for polydipsia, polyphagia and polyuria.  Genitourinary: Negative for difficulty urinating and flank pain.  Musculoskeletal: Negative for arthralgias, back pain, gait problem, joint swelling, myalgias, neck pain and neck stiffness.  Neurological: Negative for dizziness and headaches.  Hematological: Negative for adenopathy. Does not bruise/bleed easily.  Psychiatric/Behavioral: Negative for agitation, behavioral problems, confusion, decreased concentration, dysphoric mood, hallucinations, self-injury, sleep disturbance and suicidal ideas. The patient is not nervous/anxious and is not hyperactive.        Objective:   Physical Exam Vitals signs and nursing note reviewed.  Constitutional:      General: She is not in acute distress.    Appearance: Normal appearance. She is obese. She is not ill-appearing, toxic-appearing or diaphoretic.  HENT:     Head: Normocephalic and atraumatic.     Right Ear: Tympanic membrane, ear canal and external ear normal. There is no impacted cerumen.     Left Ear: Tympanic membrane, ear canal and external ear normal. There is no impacted cerumen.     Nose: Nose normal. No congestion.     Mouth/Throat:     Mouth: Mucous membranes are moist.     Pharynx: No oropharyngeal exudate.  Eyes:     Extraocular Movements: Extraocular movements intact.     Conjunctiva/sclera: Conjunctivae normal.     Pupils: Pupils are equal, round, and reactive to light.  Neck:      Musculoskeletal: Normal range of motion and neck supple.  Cardiovascular:     Rate and Rhythm: Normal rate and regular rhythm.     Pulses: Normal pulses.     Heart sounds: Normal heart sounds. No murmur. No friction rub. No gallop.   Pulmonary:     Effort: Pulmonary effort is normal. No respiratory distress.     Breath sounds: Normal breath sounds. No stridor. No wheezing, rhonchi or rales.  Chest:     Chest wall: No tenderness.  Abdominal:     General: Abdomen is protuberant. Bowel sounds are normal.     Palpations: Abdomen is soft.     Tenderness: There is no right CVA tenderness or left CVA tenderness.  Musculoskeletal: Normal range of motion.  General: No tenderness.  Skin:    General: Skin is warm and dry.     Capillary Refill: Capillary refill takes less than 2 seconds.  Neurological:     Mental Status: She is alert and oriented to person, place, and time.     Coordination: Coordination normal.  Psychiatric:        Mood and Affect: Mood normal.        Behavior: Behavior normal.        Thought Content: Thought content normal.        Judgment: Judgment normal.           Assessment & Plan:   1. Need for influenza vaccination   2. Need for shingles vaccine   3. On statin therapy   4. Hyperlipidemia, unspecified hyperlipidemia type   5. Multiple atypical nevi   6. Healthcare maintenance   7. Depression, unspecified depression type   8. Vitamin D deficiency     Hyperlipidemia Sig increase in LDL Changed statin from Atorvastatin to Rosuvastatin  Added Zetia Referral to [place] maintenance Changed Atorvastatin to Rosuvastatin - please start. Please start Zetia once daily. Referral to Cardiology/Lipid Clinic Placed. Walk 15 mins per day. Reduce saturated fat by 50%. Check blood pressure/heart rate daily. Follow-up in 3 months, If cholesterol is not greatly improved- will refer you to Lipid Clinic.  Depression Mood stable Denies  SI/HI  Vitamin D deficiency Started on Ergocalciferol  Re-check level in 4 months     FOLLOW-UP:  Return in about 3 months (around 07/09/2019) for Regular Follow Up, HTN, Hypercholestermia.

## 2019-04-08 ENCOUNTER — Encounter: Payer: Self-pay | Admitting: Adult Health

## 2019-04-08 ENCOUNTER — Ambulatory Visit (INDEPENDENT_AMBULATORY_CARE_PROVIDER_SITE_OTHER): Payer: Medicare Other | Admitting: Adult Health

## 2019-04-08 ENCOUNTER — Other Ambulatory Visit: Payer: Self-pay

## 2019-04-08 VITALS — BP 131/76 | HR 101 | Temp 99.0°F | Ht 60.75 in | Wt 182.6 lb

## 2019-04-08 DIAGNOSIS — Z79899 Other long term (current) drug therapy: Secondary | ICD-10-CM

## 2019-04-08 DIAGNOSIS — Z0000 Encounter for general adult medical examination without abnormal findings: Secondary | ICD-10-CM

## 2019-04-08 DIAGNOSIS — Z23 Encounter for immunization: Secondary | ICD-10-CM | POA: Diagnosis not present

## 2019-04-08 DIAGNOSIS — E785 Hyperlipidemia, unspecified: Secondary | ICD-10-CM

## 2019-04-08 DIAGNOSIS — F329 Major depressive disorder, single episode, unspecified: Secondary | ICD-10-CM

## 2019-04-08 DIAGNOSIS — D229 Melanocytic nevi, unspecified: Secondary | ICD-10-CM

## 2019-04-08 DIAGNOSIS — F32A Depression, unspecified: Secondary | ICD-10-CM

## 2019-04-08 DIAGNOSIS — E559 Vitamin D deficiency, unspecified: Secondary | ICD-10-CM

## 2019-04-08 MED ORDER — SHINGRIX 50 MCG/0.5ML IM SUSR: 0.5000 mL | Freq: Once | INTRAMUSCULAR | 0 refills | Status: AC

## 2019-04-08 MED ORDER — ROSUVASTATIN CALCIUM 40 MG PO TABS: 40.0000 mg | ORAL_TABLET | Freq: Every day | ORAL | 1 refills | Status: AC

## 2019-04-08 MED ORDER — EZETIMIBE 10 MG PO TABS: 10.0000 mg | ORAL_TABLET | Freq: Every day | ORAL | 0 refills | Status: DC

## 2019-04-08 NOTE — Assessment & Plan Note (Signed)
Started on Ergocalciferol  Re-check level in 4 months

## 2019-04-08 NOTE — Assessment & Plan Note (Signed)
Changed Atorvastatin to Rosuvastatin - please start. Please start Zetia once daily. Referral to Cardiology/Lipid Clinic Placed. Walk 15 mins per day. Reduce saturated fat by 50%. Check blood pressure/heart rate daily. Follow-up in 3 months, If cholesterol is not greatly improved- will refer you to Lipid Clinic.

## 2019-04-08 NOTE — Assessment & Plan Note (Addendum)
Sig increase in LDL Changed statin from Atorvastatin to Rosuvastatin  Added Zetia Referral to [facility]

## 2019-04-08 NOTE — Patient Instructions (Addendum)
Preventive Care for Adults, Female  A healthy lifestyle and preventive care can promote health and wellness. Preventive health guidelines for women include the following key practices.   A routine yearly physical is a good way to check with your health care provider about your health and preventive screening. It is a chance to share any concerns and updates on your health and to receive a thorough exam.   Visit your dentist for a routine exam and preventive care every 6 months. Brush your teeth twice a day and floss once a day. Good oral hygiene prevents tooth decay and gum disease.   The frequency of eye exams is based on your age, health, family medical history, use of contact lenses, and other factors. Follow your health care provider's recommendations for frequency of eye exams.   Eat a healthy diet. Foods like vegetables, fruits, whole grains, low-fat dairy products, and lean protein foods contain the nutrients you need without too many calories. Decrease your intake of foods high in solid fats, added sugars, and salt. Eat the right amount of calories for you.Get information about a proper diet from your health care provider, if necessary.   Regular physical exercise is one of the most important things you can do for your health. Most adults should get at least 150 minutes of moderate-intensity exercise (any activity that increases your heart rate and causes you to sweat) each week. In addition, most adults need muscle-strengthening exercises on 2 or more days a week.   Maintain a healthy weight. The body mass index (BMI) is a screening tool to identify possible weight problems. It provides an estimate of body fat based on height and weight. Your health care provider can find your BMI, and can help you achieve or maintain a healthy weight.For adults 20 years and older:   - A BMI below 18.5 is considered underweight.   - A BMI of 18.5 to 24.9 is normal.   - A BMI of 25 to 29.9 is  considered overweight.   - A BMI of 30 and above is considered obese.   Maintain normal blood lipids and cholesterol levels by exercising and minimizing your intake of trans and saturated fats.  Eat a balanced diet with plenty of fruit and vegetables. Blood tests for lipids and cholesterol should begin at age 20 and be repeated every 5 years minimum.  If your lipid or cholesterol levels are high, you are over 40, or you are at high risk for heart disease, you may need your cholesterol levels checked more frequently.Ongoing high lipid and cholesterol levels should be treated with medicines if diet and exercise are not working.   If you smoke, find out from your health care provider how to quit. If you do not use tobacco, do not start.   Lung cancer screening is recommended for adults aged 55-80 years who are at high risk for developing lung cancer because of a history of smoking. A yearly low-dose CT scan of the lungs is recommended for people who have at least a 30-pack-year history of smoking and are a current smoker or have quit within the past 15 years. A pack year of smoking is smoking an average of 1 pack of cigarettes a day for 1 year (for example: 1 pack a day for 30 years or 2 packs a day for 15 years). Yearly screening should continue until the smoker has stopped smoking for at least 15 years. Yearly screening should be stopped for people who develop a   health problem that would prevent them from having lung cancer treatment.   If you are pregnant, do not drink alcohol. If you are breastfeeding, be very cautious about drinking alcohol. If you are not pregnant and choose to drink alcohol, do not have more than 1 drink per day. One drink is considered to be 12 ounces (355 mL) of beer, 5 ounces (148 mL) of wine, or 1.5 ounces (44 mL) of liquor.   Avoid use of street drugs. Do not share needles with anyone. Ask for help if you need support or instructions about stopping the use of  drugs.   High blood pressure causes heart disease and increases the risk of stroke. Your blood pressure should be checked at least yearly.  Ongoing high blood pressure should be treated with medicines if weight loss and exercise do not work.   If you are 69-55 years old, ask your health care provider if you should take aspirin to prevent strokes.   Diabetes screening involves taking a blood sample to check your fasting blood sugar level. This should be done once every 3 years, after age 38, if you are within normal weight and without risk factors for diabetes. Testing should be considered at a younger age or be carried out more frequently if you are overweight and have at least 1 risk factor for diabetes.   Breast cancer screening is essential preventive care for women. You should practice "breast self-awareness."  This means understanding the normal appearance and feel of your breasts and may include breast self-examination.  Any changes detected, no matter how small, should be reported to a health care provider.  Women in their 80s and 30s should have a clinical breast exam (CBE) by a health care provider as part of a regular health exam every 1 to 3 years.  After age 66, women should have a CBE every year.  Starting at age 1, women should consider having a mammogram (breast X-ray test) every year.  Women who have a family history of breast cancer should talk to their health care provider about genetic screening.  Women at a high risk of breast cancer should talk to their health care providers about having an MRI and a mammogram every year.   -Breast cancer gene (BRCA)-related cancer risk assessment is recommended for women who have family members with BRCA-related cancers. BRCA-related cancers include breast, ovarian, tubal, and peritoneal cancers. Having family members with these cancers may be associated with an increased risk for harmful changes (mutations) in the breast cancer genes BRCA1 and  BRCA2. Results of the assessment will determine the need for genetic counseling and BRCA1 and BRCA2 testing.   The Pap test is a screening test for cervical cancer. A Pap test can show cell changes on the cervix that might become cervical cancer if left untreated. A Pap test is a procedure in which cells are obtained and examined from the lower end of the uterus (cervix).   - Women should have a Pap test starting at age 57.   - Between ages 90 and 70, Pap tests should be repeated every 2 years.   - Beginning at age 63, you should have a Pap test every 3 years as long as the past 3 Pap tests have been normal.   - Some women have medical problems that increase the chance of getting cervical cancer. Talk to your health care provider about these problems. It is especially important to talk to your health care provider if a  new problem develops soon after your last Pap test. In these cases, your health care provider may recommend more frequent screening and Pap tests.   - The above recommendations are the same for women who have or have not gotten the vaccine for human papillomavirus (HPV).   - If you had a hysterectomy for a problem that was not cancer or a condition that could lead to cancer, then you no longer need Pap tests. Even if you no longer need a Pap test, a regular exam is a good idea to make sure no other problems are starting.   - If you are between ages 36 and 66 years, and you have had normal Pap tests going back 10 years, you no longer need Pap tests. Even if you no longer need a Pap test, a regular exam is a good idea to make sure no other problems are starting.   - If you have had past treatment for cervical cancer or a condition that could lead to cancer, you need Pap tests and screening for cancer for at least 20 years after your treatment.   - If Pap tests have been discontinued, risk factors (such as a new sexual partner) need to be reassessed to determine if screening should  be resumed.   - The HPV test is an additional test that may be used for cervical cancer screening. The HPV test looks for the virus that can cause the cell changes on the cervix. The cells collected during the Pap test can be tested for HPV. The HPV test could be used to screen women aged 70 years and older, and should be used in women of any age who have unclear Pap test results. After the age of 67, women should have HPV testing at the same frequency as a Pap test.   Colorectal cancer can be detected and often prevented. Most routine colorectal cancer screening begins at the age of 57 years and continues through age 26 years. However, your health care provider may recommend screening at an earlier age if you have risk factors for colon cancer. On a yearly basis, your health care provider may provide home test kits to check for hidden blood in the stool.  Use of a small camera at the end of a tube, to directly examine the colon (sigmoidoscopy or colonoscopy), can detect the earliest forms of colorectal cancer. Talk to your health care provider about this at age 23, when routine screening begins. Direct exam of the colon should be repeated every 5 -10 years through age 49 years, unless early forms of pre-cancerous polyps or small growths are found.   People who are at an increased risk for hepatitis B should be screened for this virus. You are considered at high risk for hepatitis B if:  -You were born in a country where hepatitis B occurs often. Talk with your health care provider about which countries are considered high risk.  - Your parents were born in a high-risk country and you have not received a shot to protect against hepatitis B (hepatitis B vaccine).  - You have HIV or AIDS.  - You use needles to inject street drugs.  - You live with, or have sex with, someone who has Hepatitis B.  - You get hemodialysis treatment.  - You take certain medicines for conditions like cancer, organ  transplantation, and autoimmune conditions.   Hepatitis C blood testing is recommended for all people born from [birth year] through 1965 and any individual  with known risks for hepatitis C.   Practice safe sex. Use condoms and avoid high-risk sexual practices to reduce the spread of sexually transmitted infections (STIs). STIs include gonorrhea, chlamydia, syphilis, trichomonas, herpes, HPV, and human immunodeficiency virus (HIV). Herpes, HIV, and HPV are viral illnesses that have no cure. They can result in disability, cancer, and death. Sexually active women aged 68 years and younger should be checked for chlamydia. Older women with new or multiple partners should also be tested for chlamydia. Testing for other STIs is recommended if you are sexually active and at increased risk.   Osteoporosis is a disease in which the bones lose minerals and strength with aging. This can result in serious bone fractures or breaks. The risk of osteoporosis can be identified using a bone density scan. Women ages 54 years and over and women at risk for fractures or osteoporosis should discuss screening with their health care providers. Ask your health care provider whether you should take a calcium supplement or vitamin D to There are also several preventive steps women can take to avoid osteoporosis and resulting fractures or to keep osteoporosis from worsening. -->Recommendations include:  Eat a balanced diet high in fruits, vegetables, calcium, and vitamins.  Get enough calcium. The recommended total intake of is 1,200 mg daily; for best absorption, if taking supplements, divide doses into 250-500 mg doses throughout the day. Of the two types of calcium, calcium carbonate is best absorbed when taken with food but calcium citrate can be taken on an empty stomach.  Get enough vitamin D. [employer] and the [employer] recommend at least 1,000 IU per day for women age 62 and over who are at risk of vitamin D  deficiency. Vitamin D deficiency can be caused by inadequate sun exposure (for example, those who live in [place]).  Avoid alcohol and smoking. Heavy alcohol intake (more than 7 drinks per week) increases the risk of falls and hip fracture and women smokers tend to lose bone more rapidly and have lower bone mass than nonsmokers. Stopping smoking is one of the most important changes women can make to improve their health and decrease risk for disease.  Be physically active every day. Weight-bearing exercise (for example, fast walking, hiking, jogging, and weight training) may strengthen bones or slow the rate of bone loss that comes with aging. Balancing and muscle-strengthening exercises can reduce the risk of falling and fracture.  Consider therapeutic medications. Currently, several types of effective drugs are available. Healthcare providers can recommend the type most appropriate for each woman.  Eliminate environmental factors that may contribute to accidents. Falls cause nearly 90% of all osteoporotic fractures, so reducing this risk is an important bone-health strategy. Measures include ample lighting, removing obstructions to walking, using nonskid rugs on floors, and placing mats and/or grab bars in showers.  Be aware of medication side effects. Some common medicines make bones weaker. These include a type of steroid drug called glucocorticoids used for arthritis and asthma, some antiseizure drugs, certain sleeping pills, treatments for endometriosis, and some cancer drugs. An overactive thyroid gland or using too much thyroid hormone for an underactive thyroid can also be a problem. If you are taking these medicines, talk to your doctor about what you can do to help protect your bones.reduce the rate of osteoporosis.    Menopause can be associated with physical symptoms and risks. Hormone replacement therapy is available to decrease symptoms and risks. You should talk to your  health care provider  about whether hormone replacement therapy is right for you.   Use sunscreen. Apply sunscreen liberally and repeatedly throughout the day. You should seek shade when your shadow is shorter than you. Protect yourself by wearing long sleeves, pants, a wide-brimmed hat, and sunglasses year round, whenever you are outdoors.   Once a month, do a whole body skin exam, using a mirror to look at the skin on your back. Tell your health care provider of new moles, moles that have irregular borders, moles that are larger than a pencil eraser, or moles that have changed in shape or color.   -Stay current with required vaccines (immunizations).   Influenza vaccine. All adults should be immunized every year.  Tetanus, diphtheria, and acellular pertussis (Td, Tdap) vaccine. Pregnant women should receive 1 dose of Tdap vaccine during each pregnancy. The dose should be obtained regardless of the length of time since the last dose. Immunization is preferred during the 27th 36th week of gestation. An adult who has not previously received Tdap or who does not know her vaccine status should receive 1 dose of Tdap. This initial dose should be followed by tetanus and diphtheria toxoids (Td) booster doses every 10 years. Adults with an unknown or incomplete history of completing a 3-dose immunization series with Td-containing vaccines should begin or complete a primary immunization series including a Tdap dose. Adults should receive a Td booster every 10 years.  Varicella vaccine. An adult without evidence of immunity to varicella should receive 2 doses or a second dose if she has previously received 1 dose. Pregnant females who do not have evidence of immunity should receive the first dose after pregnancy. This first dose should be obtained before leaving the health care facility. The second dose should be obtained 4 8 weeks after the first dose.  Human papillomavirus (HPV) vaccine. Females aged 13 26  years who have not received the vaccine previously should obtain the 3-dose series. The vaccine is not recommended for use in pregnant females. However, pregnancy testing is not needed before receiving a dose. If a female is found to be pregnant after receiving a dose, no treatment is needed. In that case, the remaining doses should be delayed until after the pregnancy. Immunization is recommended for any person with an immunocompromised condition through the age of 26 years if she did not get any or all doses earlier. During the 3-dose series, the second dose should be obtained 4 8 weeks after the first dose. The third dose should be obtained 24 weeks after the first dose and 16 weeks after the second dose.  Zoster vaccine. One dose is recommended for adults aged 60 years or older unless certain conditions are present.  Measles, mumps, and rubella (MMR) vaccine. Adults born before 1957 generally are considered immune to measles and mumps. Adults born in 1957 or later should have 1 or more doses of MMR vaccine unless there is a contraindication to the vaccine or there is laboratory evidence of immunity to each of the three diseases. A routine second dose of MMR vaccine should be obtained at least 28 days after the first dose for students attending postsecondary schools, health care workers, or international travelers. People who received inactivated measles vaccine or an unknown type of measles vaccine during 1963 1967 should receive 2 doses of MMR vaccine. People who received inactivated mumps vaccine or an unknown type of mumps vaccine before 1979 and are at high risk for mumps infection should consider immunization with 2 doses of   MMR vaccine. For females of childbearing age, rubella immunity should be determined. If there is no evidence of immunity, females who are not pregnant should be vaccinated. If there is no evidence of immunity, females who are pregnant should delay immunization until after pregnancy.  Unvaccinated health care workers born before [birth year] who lack laboratory evidence of measles, mumps, or rubella immunity or laboratory confirmation of disease should consider measles and mumps immunization with 2 doses of MMR vaccine or rubella immunization with 1 dose of MMR vaccine.  Pneumococcal 13-valent conjugate (PCV13) vaccine. When indicated, a person who is uncertain of her immunization history and has no record of immunization should receive the PCV13 vaccine. An adult aged 54 years or older who has certain medical conditions and has not been previously immunized should receive 1 dose of PCV13 vaccine. This PCV13 should be followed with a dose of pneumococcal polysaccharide (PPSV23) vaccine. The PPSV23 vaccine dose should be obtained at least 8 weeks after the dose of PCV13 vaccine. An adult aged 58 years or older who has certain medical conditions and previously received 1 or more doses of PPSV23 vaccine should receive 1 dose of PCV13. The PCV13 vaccine dose should be obtained 1 or more years after the last PPSV23 vaccine dose.  Pneumococcal polysaccharide (PPSV23) vaccine. When PCV13 is also indicated, PCV13 should be obtained first. All adults aged 58 years and older should be immunized. An adult younger than age 65 years who has certain medical conditions should be immunized. Any person who resides in a nursing home or long-term care facility should be immunized. An adult smoker should be immunized. People with an immunocompromised condition and certain other conditions should receive both PCV13 and PPSV23 vaccines. People with human immunodeficiency virus (HIV) infection should be immunized as soon as possible after diagnosis. Immunization during chemotherapy or radiation therapy should be avoided. Routine use of PPSV23 vaccine is not recommended for American Indians, [place] Natives, or people younger than 65 years unless there are medical conditions that require PPSV23 vaccine. When indicated,  people who have unknown immunization and have no record of immunization should receive PPSV23 vaccine. One-time revaccination 5 years after the first dose of PPSV23 is recommended for people aged 70 64 years who have chronic kidney failure, nephrotic syndrome, asplenia, or immunocompromised conditions. People who received 1 2 doses of PPSV23 before age 32 years should receive another dose of PPSV23 vaccine at age 96 years or later if at least 5 years have passed since the previous dose. Doses of PPSV23 are not needed for people immunized with PPSV23 at or after age 55 years.  Meningococcal vaccine. Adults with asplenia or persistent complement component deficiencies should receive 2 doses of quadrivalent meningococcal conjugate (MenACWY-D) vaccine. The doses should be obtained at least 2 months apart. Microbiologists working with certain meningococcal bacteria, [employer] recruits, people at risk during an outbreak, and people who travel to or live in countries with a high rate of meningitis should be immunized. A first-year college student up through age 58 years who is living in a residence hall should receive a dose if she did not receive a dose on or after her 16th birthday. Adults who have certain high-risk conditions should receive one or more doses of vaccine.  Hepatitis A vaccine. Adults who wish to be protected from this disease, have certain high-risk conditions, work with hepatitis A-infected animals, work in hepatitis A research labs, or travel to or work in countries with a high rate of hepatitis A should be  immunized. Adults who were previously unvaccinated and who anticipate close contact with an international adoptee during the first 60 days after arrival in the [place] from a country with a high rate of hepatitis A should be immunized.  Hepatitis B vaccine.  Adults who wish to be protected from this disease, have certain high-risk conditions, may be exposed to blood or other infectious  body fluids, are household contacts or sex partners of hepatitis B positive people, are clients or workers in certain care facilities, or travel to or work in countries with a high rate of hepatitis B should be immunized.  Haemophilus influenzae type b (Hib) vaccine. A previously unvaccinated person with asplenia or sickle cell disease or having a scheduled splenectomy should receive 1 dose of Hib vaccine. Regardless of previous immunization, a recipient of a hematopoietic stem cell transplant should receive a 3-dose series 6 12 months after her successful transplant. Hib vaccine is not recommended for adults with HIV infection.  Preventive Services / Frequency Ages 6 to 39years  Blood pressure check.** / Every 1 to 2 years.  Lipid and cholesterol check.** / Every 5 years beginning at age 39.  Clinical breast exam.** / Every 3 years for women in their 61s and 62s.  BRCA-related cancer risk assessment.** / For women who have family members with a BRCA-related cancer (breast, ovarian, tubal, or peritoneal cancers).  Pap test.** / Every 2 years from ages 47 through 85. Every 3 years starting at age 34 through age 12 or 74 with a history of 3 consecutive normal Pap tests.  HPV screening.** / Every 3 years from ages 46 through ages 43 to 54 with a history of 3 consecutive normal Pap tests.  Hepatitis C blood test.** / For any individual with known risks for hepatitis C.  Skin self-exam. / Monthly.  Influenza vaccine. / Every year.  Tetanus, diphtheria, and acellular pertussis (Tdap, Td) vaccine.** / Consult your health care provider. Pregnant women should receive 1 dose of Tdap vaccine during each pregnancy. 1 dose of Td every 10 years.  Varicella vaccine.** / Consult your health care provider. Pregnant females who do not have evidence of immunity should receive the first dose after pregnancy.  HPV vaccine. / 3 doses over 6 months, if 64 and younger. The vaccine is not recommended for use in  pregnant females. However, pregnancy testing is not needed before receiving a dose.  Measles, mumps, rubella (MMR) vaccine.** / You need at least 1 dose of MMR if you were born in 1957 or later. You may also need a 2nd dose. For females of childbearing age, rubella immunity should be determined. If there is no evidence of immunity, females who are not pregnant should be vaccinated. If there is no evidence of immunity, females who are pregnant should delay immunization until after pregnancy.  Pneumococcal 13-valent conjugate (PCV13) vaccine.** / Consult your health care provider.  Pneumococcal polysaccharide (PPSV23) vaccine.** / 1 to 2 doses if you smoke cigarettes or if you have certain conditions.  Meningococcal vaccine.** / 1 dose if you are age 71 to 37 years and a [employer] researcher living in a residence hall, or have one of several medical conditions, you need to get vaccinated against meningococcal disease. You may also need additional booster doses.  Hepatitis A vaccine.** / Consult your health care provider.  Hepatitis B vaccine.** / Consult your health care provider.  Haemophilus influenzae type b (Hib) vaccine.** / Consult your health care provider.  Ages 55 to 64years  Blood pressure check.** / Every 1 to 2 years.  Lipid and cholesterol check.** / Every 5 years beginning at age 76 years.  Lung cancer screening. / Every year if you are aged 91 80 years and have a 30-pack-year history of smoking and currently smoke or have quit within the past 15 years. Yearly screening is stopped once you have quit smoking for at least 15 years or develop a health problem that would prevent you from having lung cancer treatment.  Clinical breast exam.** / Every year after age 34 years.  BRCA-related cancer risk assessment.** / For women who have family members with a BRCA-related cancer (breast, ovarian, tubal, or peritoneal cancers).  Mammogram.** / Every year beginning at age 58  years and continuing for as long as you are in good health. Consult with your health care provider.  Pap test.** / Every 3 years starting at age 43 years through age 44 or 67 years with a history of 3 consecutive normal Pap tests.  HPV screening.** / Every 3 years from ages 48 years through ages 14 to 5 years with a history of 3 consecutive normal Pap tests.  Fecal occult blood test (FOBT) of stool. / Every year beginning at age 66 years and continuing until age 45 years. You may not need to do this test if you get a colonoscopy every 10 years.  Flexible sigmoidoscopy or colonoscopy.** / Every 5 years for a flexible sigmoidoscopy or every 10 years for a colonoscopy beginning at age 62 years and continuing until age 13 years.  Hepatitis C blood test.** / For all people born from [birth year] through 1965 and any individual with known risks for hepatitis C.  Skin self-exam. / Monthly.  Influenza vaccine. / Every year.  Tetanus, diphtheria, and acellular pertussis (Tdap/Td) vaccine.** / Consult your health care provider. Pregnant women should receive 1 dose of Tdap vaccine during each pregnancy. 1 dose of Td every 10 years.  Varicella vaccine.** / Consult your health care provider. Pregnant females who do not have evidence of immunity should receive the first dose after pregnancy.  Zoster vaccine.** / 1 dose for adults aged 67 years or older.  Measles, mumps, rubella (MMR) vaccine.** / You need at least 1 dose of MMR if you were born in 1957 or later. You may also need a 2nd dose. For females of childbearing age, rubella immunity should be determined. If there is no evidence of immunity, females who are not pregnant should be vaccinated. If there is no evidence of immunity, females who are pregnant should delay immunization until after pregnancy.  Pneumococcal 13-valent conjugate (PCV13) vaccine.** / Consult your health care provider.  Pneumococcal polysaccharide (PPSV23) vaccine.** / 1 to 2 doses if  you smoke cigarettes or if you have certain conditions.  Meningococcal vaccine.** / Consult your health care provider.  Hepatitis A vaccine.** / Consult your health care provider.  Hepatitis B vaccine.** / Consult your health care provider.  Haemophilus influenzae type b (Hib) vaccine.** / Consult your health care provider.  Ages 26 years and over  Blood pressure check.** / Every 1 to 2 years.  Lipid and cholesterol check.** / Every 5 years beginning at age 51 years.  Lung cancer screening. / Every year if you are aged 31 80 years and have a 30-pack-year history of smoking and currently smoke or have quit within the past 15 years. Yearly screening is stopped once you have quit smoking for at least 15 years or develop a health problem that  would prevent you from having lung cancer treatment.  Clinical breast exam.** / Every year after age 103 years.  BRCA-related cancer risk assessment.** / For women who have family members with a BRCA-related cancer (breast, ovarian, tubal, or peritoneal cancers).  Mammogram.** / Every year beginning at age 36 years and continuing for as long as you are in good health. Consult with your health care provider.  Pap test.** / Every 3 years starting at age 5 years through age 85 or 10 years with 3 consecutive normal Pap tests. Testing can be stopped between 65 and 70 years with 3 consecutive normal Pap tests and no abnormal Pap or HPV tests in the past 10 years.  HPV screening.** / Every 3 years from ages 93 years through ages 70 or 45 years with a history of 3 consecutive normal Pap tests. Testing can be stopped between 65 and 70 years with 3 consecutive normal Pap tests and no abnormal Pap or HPV tests in the past 10 years.  Fecal occult blood test (FOBT) of stool. / Every year beginning at age 8 years and continuing until age 45 years. You may not need to do this test if you get a colonoscopy every 10 years.  Flexible sigmoidoscopy or colonoscopy.** /  Every 5 years for a flexible sigmoidoscopy or every 10 years for a colonoscopy beginning at age 69 years and continuing until age 68 years.  Hepatitis C blood test.** / For all people born from [birth year] through 1967 and any individual with known risks for hepatitis C.  Osteoporosis screening.** / A one-time screening for women ages 7 years and over and women at risk for fractures or osteoporosis.  Skin self-exam. / Monthly.  Influenza vaccine. / Every year.  Tetanus, diphtheria, and acellular pertussis (Tdap/Td) vaccine.** / 1 dose of Td every 10 years.  Varicella vaccine.** / Consult your health care provider.  Zoster vaccine.** / 1 dose for adults aged 5 years or older.  Pneumococcal 13-valent conjugate (PCV13) vaccine.** / Consult your health care provider.  Pneumococcal polysaccharide (PPSV23) vaccine.** / 1 dose for all adults aged 74 years and older.  Meningococcal vaccine.** / Consult your health care provider.  Hepatitis A vaccine.** / Consult your health care provider.  Hepatitis B vaccine.** / Consult your health care provider.  Haemophilus influenzae type b (Hib) vaccine.** / Consult your health care provider. ** Family history and personal history of risk and conditions may change your health care provider's recommendations. Document Released: 07/05/2001 Document Revised: 02/27/2013  [facility] Patient Information 2014 [place], [place].   EXERCISE AND DIET:  We recommended that you start or continue a regular exercise program for good health. Regular exercise means any activity that makes your heart beat faster and makes you sweat.  We recommend exercising at least 30 minutes per day at least 3 days a week, preferably 5.  We also recommend a diet low in fat and sugar / carbohydrates.  Inactivity, poor dietary choices and obesity can cause diabetes, heart attack, stroke, and kidney damage, among others.     ALCOHOL AND SMOKING:  Women should limit their alcohol intake to no  more than 7 drinks/beers/glasses of wine (combined, not each!) per week. Moderation of alcohol intake to this level decreases your risk of breast cancer and liver damage.  ( And of course, no recreational drugs are part of a healthy lifestyle.)  Also, you should not be smoking at all or even being exposed to second hand smoke. Most people know smoking can  cause cancer, and various heart and lung diseases, but did you know it also contributes to weakening of your bones?  Aging of your skin?  Yellowing of your teeth and nails?   CALCIUM AND VITAMIN D:  Adequate intake of calcium and Vitamin D are recommended.  The recommendations for exact amounts of these supplements seem to change often, but generally speaking 600 mg of calcium (either carbonate or citrate) and 800 units of Vitamin D per day seems prudent. Certain women may benefit from higher intake of Vitamin D.  If you are among these women, your doctor will have told you during your visit.     PAP SMEARS:  Pap smears, to check for cervical cancer or precancers,  have traditionally been done yearly, although recent scientific advances have shown that most women can have pap smears less often.  However, every woman still should have a physical exam from her gynecologist or primary care physician every year. It will include a breast check, inspection of the vulva and vagina to check for abnormal growths or skin changes, a visual exam of the cervix, and then an exam to evaluate the size and shape of the uterus and ovaries.  And after 70 years of age, a rectal exam is indicated to check for rectal cancers. We will also provide age appropriate advice regarding health maintenance, like when you should have certain vaccines, screening for sexually transmitted diseases, bone density testing, colonoscopy, mammograms, etc.    MAMMOGRAMS:  All women over 15 years old should have a yearly mammogram. Many facilities now offer a "3D" mammogram, which may cost  around $50 extra out of pocket. If possible,  we recommend you accept the option to have the 3D mammogram performed.  It both reduces the number of women who will be called back for extra views which then turn out to be normal, and it is better than the routine mammogram at detecting truly abnormal areas.     COLONOSCOPY:  Colonoscopy to screen for colon cancer is recommended for all women at age 61.  We know, you hate the idea of the prep.  We agree, BUT, having colon cancer and not knowing it is worse!!  Colon cancer so often starts as a polyp that can be seen and removed at colonscopy, which can quite literally save your life!  And if your first colonoscopy is normal and you have no family history of colon cancer, most women don't have to have it again for 10 years.  Once every ten years, you can do something that may end up saving your life, right?  We will be happy to help you get it scheduled when you are ready.  Be sure to check your insurance coverage so you understand how much it will cost.  It may be covered as a preventative service at no cost, but you should check your particular policy.   Changed Atorvastatin to Rosuvastatin - please start. Please start Zetia once daily. Referral to Cardiology/Lipid Clinic Placed. Walk 15 mins per day. Reduce saturated fat by 50%. Check blood pressure/heart rate daily. Follow-up in 3 months, If cholesterol is not greatly improved- will refer you to Lipid Clinic. GREAT TO SEE YOU!

## 2019-04-08 NOTE — Assessment & Plan Note (Signed)
Mood stable Denies SI/HI

## 2019-04-09 ENCOUNTER — Ambulatory Visit (INDEPENDENT_AMBULATORY_CARE_PROVIDER_SITE_OTHER): Payer: Medicare Other | Admitting: Psychology

## 2019-04-09 DIAGNOSIS — F332 Major depressive disorder, recurrent severe without psychotic features: Secondary | ICD-10-CM | POA: Diagnosis not present

## 2019-04-16 ENCOUNTER — Ambulatory Visit (INDEPENDENT_AMBULATORY_CARE_PROVIDER_SITE_OTHER): Payer: Medicare Other | Admitting: Psychology

## 2019-04-16 DIAGNOSIS — F332 Major depressive disorder, recurrent severe without psychotic features: Secondary | ICD-10-CM | POA: Diagnosis not present

## 2019-04-17 ENCOUNTER — Other Ambulatory Visit: Payer: Self-pay | Admitting: Adult Health

## 2019-04-19 ENCOUNTER — Other Ambulatory Visit: Payer: Self-pay | Admitting: Adult Health

## 2019-04-25 ENCOUNTER — Ambulatory Visit (INDEPENDENT_AMBULATORY_CARE_PROVIDER_SITE_OTHER): Payer: Medicare Other | Admitting: Psychology

## 2019-04-25 DIAGNOSIS — F332 Major depressive disorder, recurrent severe without psychotic features: Secondary | ICD-10-CM | POA: Diagnosis not present

## 2019-04-29 ENCOUNTER — Encounter: Payer: Self-pay | Admitting: General Practice

## 2019-04-30 ENCOUNTER — Ambulatory Visit (INDEPENDENT_AMBULATORY_CARE_PROVIDER_SITE_OTHER): Payer: Medicare Other | Admitting: Psychology

## 2019-04-30 DIAGNOSIS — F332 Major depressive disorder, recurrent severe without psychotic features: Secondary | ICD-10-CM | POA: Diagnosis not present

## 2019-05-05 ENCOUNTER — Other Ambulatory Visit: Payer: Self-pay | Admitting: Adult Health

## 2019-05-09 ENCOUNTER — Ambulatory Visit (INDEPENDENT_AMBULATORY_CARE_PROVIDER_SITE_OTHER): Payer: Medicare Other | Admitting: Psychology

## 2019-05-09 DIAGNOSIS — F332 Major depressive disorder, recurrent severe without psychotic features: Secondary | ICD-10-CM

## 2019-05-11 ENCOUNTER — Other Ambulatory Visit: Payer: Self-pay | Admitting: Adult Health

## 2019-05-11 DIAGNOSIS — F331 Major depressive disorder, recurrent, moderate: Secondary | ICD-10-CM

## 2019-05-14 ENCOUNTER — Other Ambulatory Visit: Payer: Self-pay | Admitting: Adult Health

## 2019-05-23 ENCOUNTER — Ambulatory Visit: Payer: Medicare Other | Admitting: Podiatry

## 2019-05-23 ENCOUNTER — Ambulatory Visit: Payer: Medicare Other | Admitting: Adult Health

## 2019-05-28 ENCOUNTER — Encounter: Payer: Self-pay | Admitting: Podiatry

## 2019-05-28 ENCOUNTER — Ambulatory Visit: Payer: Medicare Other | Admitting: Podiatry

## 2019-05-28 ENCOUNTER — Other Ambulatory Visit: Payer: Self-pay

## 2019-05-28 ENCOUNTER — Ambulatory Visit: Payer: Medicare Other | Admitting: Psychology

## 2019-05-28 VITALS — BP 131/70 | HR 72 | Resp 16

## 2019-05-28 DIAGNOSIS — L600 Ingrowing nail: Secondary | ICD-10-CM | POA: Diagnosis not present

## 2019-05-28 MED ORDER — NEOMYCIN-POLYMYXIN-HC 1 % OT SOLN: OTIC | 1 refills | Status: AC

## 2019-05-28 NOTE — Patient Instructions (Signed)

## 2019-05-29 NOTE — Progress Notes (Signed)
Subjective:  Patient ID: Toni Reyes, female    DOB: Dec 10, 1950,  MRN: [id number] HPI Chief Complaint  Patient presents with  . Toe Pain    Hallux left - lateral border, tender, red, swollen x 3 weeks, using neosporin and bandaid  . New Patient (Initial Visit)    69 y.o. female presents with the above complaint.   ROS: Denies fever chills nausea vomiting muscle aches pains calf pain back pain chest pain shortness of breath.  Past Medical History:  Diagnosis Date  . Depression   . High cholesterol   . Hypertension    Past Surgical History:  Procedure Laterality Date  . BRAIN SURGERY     meningioma  . BREAST SURGERY     augmentation  . CESAREAN SECTION    . CHOLECYSTECTOMY    . CRANIOTOMY    . EYE SURGERY Bilateral    Lasix  . LAMINECTOMY    . TUBAL LIGATION      Current Outpatient Medications:  .  amLODipine (NORVASC) 2.5 MG tablet, TAKE 1 TABLET BY MOUTH  DAILY, Disp: 30 tablet, Rfl: 0 .  buPROPion (WELLBUTRIN XL) 150 MG 24 hr tablet, TAKE 1 TABLET BY MOUTH  DAILY, Disp: 90 tablet, Rfl: 0 .  ezetimibe (ZETIA) 10 MG tablet, Take 1 tablet (10 mg total) by mouth daily., Disp: 90 tablet, Rfl: 0 .  NEOMYCIN-POLYMYXIN-HYDROCORTISONE (CORTISPORIN) 1 % SOLN OTIC solution, Apply 1-2 drops to toe BID after soaking, Disp: 10 mL, Rfl: 1 .  rosuvastatin (CRESTOR) 40 MG tablet, Take 1 tablet (40 mg total) by mouth daily., Disp: 90 tablet, Rfl: 1 .  venlafaxine XR (EFFEXOR-XR) 150 MG 24 hr capsule, TAKE 1 CAPSULE BY MOUTH  DAILY WITH BREAKFAST, Disp: 90 capsule, Rfl: 0 .  Vitamin D, Ergocalciferol, (DRISDOL) 1.25 MG (50000 UT) CAPS capsule, Take 1 capsule (50,000 Units total) by mouth every 7 (seven) days., Disp: 16 capsule, Rfl: 0  No Known Allergies Review of Systems Objective:   Vitals:   05/28/19 1022  BP: 131/70  Pulse: 72  Resp: 16    General: Well developed, nourished, in no acute distress, alert and oriented x3   Dermatological: Skin is warm, dry and supple  bilateral. Nails x 10 are well maintained; remaining integument appears unremarkable at this time. There are no open sores, no preulcerative lesions, no rash or signs of infection present.  Sharp incurvated nail margin with mild erythema and tenderness fibular border hallux left no purulence no malodor no granulation tissue present.  Slight maceration present.  Vascular: Dorsalis Pedis artery and Posterior Tibial artery pedal pulses are 2/4 bilateral with immedate capillary fill time. Pedal hair growth present. No varicosities and no lower extremity edema present bilateral.   Neruologic: Grossly intact via light touch bilateral. Vibratory intact via tuning fork bilateral. Protective threshold with Semmes Wienstein monofilament intact to all pedal sites bilateral. Patellar and Achilles deep tendon reflexes 2+ bilateral. No Babinski or clonus noted bilateral.   Musculoskeletal: No gross boney pedal deformities bilateral. No pain, crepitus, or limitation noted with foot and ankle range of motion bilateral. Muscular strength 5/5 in all groups tested bilateral.  Gait: Unassisted, Nonantalgic.    Radiographs:  None taken  Assessment & Plan:   Assessment: Ingrown toenail lateral border hallux left.  Plan: Chemical matricectomy was performed today after local anesthetic was administered.  She tolerated procedure well without complications.  She was provided with both oral and written home-going instruction for the care and soaking of the toe.  She was provided with a prescription for Cortisporin Otic to be applied twice daily after soaking.  She will cover it daily with Band-Aids and I will follow-up with her in 2 weeks.     Nazar Kuan T. [place], [place]

## 2019-06-04 ENCOUNTER — Ambulatory Visit (INDEPENDENT_AMBULATORY_CARE_PROVIDER_SITE_OTHER): Payer: Medicare Other | Admitting: Psychology

## 2019-06-04 DIAGNOSIS — F332 Major depressive disorder, recurrent severe without psychotic features: Secondary | ICD-10-CM | POA: Diagnosis not present

## 2019-06-06 ENCOUNTER — Other Ambulatory Visit: Payer: Self-pay | Admitting: Family Medicine

## 2019-06-06 DIAGNOSIS — I10 Essential (primary) hypertension: Secondary | ICD-10-CM

## 2019-06-13 ENCOUNTER — Ambulatory Visit: Payer: Medicare Other | Admitting: Psychology

## 2019-06-13 ENCOUNTER — Ambulatory Visit: Payer: Medicare Other | Admitting: Podiatry

## 2019-07-15 ENCOUNTER — Other Ambulatory Visit: Payer: Self-pay | Admitting: Adult Health

## 2019-07-15 DIAGNOSIS — F331 Major depressive disorder, recurrent, moderate: Secondary | ICD-10-CM

## 2019-07-18 ENCOUNTER — Other Ambulatory Visit: Payer: Self-pay | Admitting: Adult Health

## 2019-08-07 ENCOUNTER — Other Ambulatory Visit: Payer: Self-pay | Admitting: Family Medicine

## 2019-08-07 MED ORDER — VENLAFAXINE HCL ER 150 MG PO CP24: 150.0000 mg | ORAL_CAPSULE | Freq: Every day | ORAL | 0 refills | Status: AC

## 2019-11-22 ENCOUNTER — Other Ambulatory Visit (HOSPITAL_COMMUNITY): Payer: Self-pay | Admitting: Family

## 2019-11-22 ENCOUNTER — Other Ambulatory Visit: Payer: Self-pay | Admitting: Family

## 2019-11-22 DIAGNOSIS — R748 Abnormal levels of other serum enzymes: Secondary | ICD-10-CM

## 2019-12-03 ENCOUNTER — Ambulatory Visit: Payer: Self-pay

## 2019-12-03 ENCOUNTER — Ambulatory Visit (INDEPENDENT_AMBULATORY_CARE_PROVIDER_SITE_OTHER): Payer: Medicare Other | Admitting: Family Medicine

## 2019-12-03 ENCOUNTER — Encounter: Payer: Self-pay | Admitting: Family Medicine

## 2019-12-03 ENCOUNTER — Other Ambulatory Visit: Payer: Self-pay

## 2019-12-03 DIAGNOSIS — M5441 Lumbago with sciatica, right side: Secondary | ICD-10-CM | POA: Diagnosis not present

## 2019-12-03 DIAGNOSIS — G8929 Other chronic pain: Secondary | ICD-10-CM

## 2019-12-03 NOTE — Progress Notes (Signed)
Office Visit Note   Patient: Toni Reyes           Date of Birth: March 22, 1951           MRN: [id number] Visit Date: 12/03/2019 Requested by: No referring provider defined for this encounter. PCP: No primary care provider on file.  Subjective: Chief Complaint  Patient presents with  . Lower Back - Pain    Right SI joint pain, "almost constant" dull ache. "Grabbing pain" in that area with twisting motion at the waist. Stairs are difficult for her. H/o laminectomy. Pain radiates down the anterior thigh to the knee. Occ right groin pain.    HPI: She is here with right low back pain.  She states that she has had intermittent problems for couple years, but pain on a daily basis for the past year.  Pain is most intense when she stands and twists her body to the right.  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  This pain feels different than that.  She does not take medicine on a regular basis for her pain but she was recently given meloxicam and that seems to help quite a bit.  She has been working with a Restaurant manager, fast food but it does not seem to be giving her pain relief.               ROS:   All other systems were reviewed and are negative.  Objective: Vital Signs: There were no vitals taken for this visit.  Physical Exam:  General:  Alert and oriented, in no acute distress. Pulm:  Breathing unlabored. Psy:  Normal mood, congruent affect  Low back: There is slight tenderness near the right SI joint.  No pain in the sciatic notch.  Negative straight leg raise, lower extremity strength and reflexes are normal.  She has exquisite pain and decreased range of motion with passive hip flexion and internal rotation.  This seems to reproduce her pain.  Imaging: XR Lumbar Spine 2-3 Views  Result Date: 12/03/2019 X-rays lumbar spine reveal moderate degenerative disc disease at L4-5 and L5-S1.  Postsurgical changes of cholecystectomy are evident.   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   Assessment & Plan: 1.  Chronic low back/right hip pain most likely due to hip DJD -She will take meloxicam as needed.  She will try glucosamine and turmeric as well.  Straight leg raises for strengthening.  If symptoms worsen, we will do a diagnostic/therapeutic intra-articular injection.  I will see her back as needed.     Procedures: No procedures performed  No notes on file     PMFS History: Patient Active Problem List   Diagnosis Date Noted  . Right otitis media 01/24/2018  . Vitamin D deficiency 03/29/2017  . Hypertension 12/22/2016  . Hyperlipidemia 12/22/2016  . Fatigue 12/22/2016  . Healthcare maintenance 12/22/2016  . Depression 03/10/2015  . Osteopenia 12/12/2013  . Constipation 02/09/2012  . Obesity (BMI 30-39.9) 02/09/2012  . Urinary incontinence 02/09/2012   Past Medical History:  Diagnosis Date  . Depression   . High cholesterol   . Hypertension     Family History  Problem Relation Age of Onset  . Emphysema Mother   . Cancer Father        metastatic  . Cancer Sister        lung  . Emphysema Sister   . Healthy Daughter   . Healthy Son   .  Cancer Maternal Grandfather        bladder  . Cancer Sister        melanoma  . COPD Sister   . CAD Brother   . Stroke Brother   . Healthy Brother   . Healthy Brother     Past Surgical History:  Procedure Laterality Date  . BRAIN SURGERY     meningioma  . BREAST SURGERY     augmentation  . CESAREAN SECTION    . CHOLECYSTECTOMY    . CRANIOTOMY    . EYE SURGERY Bilateral    Lasix  . LAMINECTOMY    . TUBAL LIGATION     Social History   Occupational History  . Occupation: Retired  Tobacco Use  . Smoking status: Former Smoker    Packs/day: 2.00    Years: 26.00    Pack years: 52.00    Types: Cigarettes    Quit date: 02/22/1985    Years since quitting: 34.8  . Smokeless tobacco: Never Used  Vaping Use  . Vaping Use: Never used  Substance and Sexual  Activity  . Alcohol use: Yes    Comment: rarely  . Drug use: No  . Sexual activity: Not Currently    Birth control/protection: Abstinence

## 2019-12-04 ENCOUNTER — Ambulatory Visit (HOSPITAL_COMMUNITY)
Admission: RE | Admit: 2019-12-04 | Discharge: 2019-12-04 | Disposition: A | Discharge status: Home / Self Care | Payer: Medicare Other | Source: Ambulatory Visit | Attending: Family | Admitting: Family

## 2019-12-04 DIAGNOSIS — R748 Abnormal levels of other serum enzymes: Secondary | ICD-10-CM | POA: Diagnosis not present

## 2019-12-12 ENCOUNTER — Encounter: Payer: Self-pay | Admitting: Internal Medicine

## 2020-02-11 NOTE — Progress Notes (Deleted)
Referring Provider: [facility] Primary Care Physician:  Campbell Stall, FNP Primary Gastroenterologist:  Dr. Rayne Du chief complaint on file.   HPI:   Toni Reyes is a 69 y.o. female presenting today at the request of [facility] centers of the [facility] for elevated liver enzymes and hepatic steatosis.  Reviewed labs in our system.  Last labs completed in October 2020.  Appears patient has had mildly elevated alk phos dating back to October 2018 with normal AST, ALT, and bilirubin.  No GGT on file.  CBC has been within normal limits.  Ultrasound abdomen complete 12/04/2019: Gallbladder surgically absent, no intrahepatic, common hepatic, or CBD dilation, hepatic steatosis without focal liver lesion, spleen within normal limits.  DEXA scan in May 2019 with osteopenia.  Today:   Past Medical History:  Diagnosis Date  . Depression   . High cholesterol   . Hypertension     Past Surgical History:  Procedure Laterality Date  . BRAIN SURGERY     meningioma  . BREAST SURGERY     augmentation  . CESAREAN SECTION    . CHOLECYSTECTOMY    . CRANIOTOMY    . EYE SURGERY Bilateral    Lasix  . LAMINECTOMY    . TUBAL LIGATION      Current Outpatient Medications  Medication Sig Dispense Refill  . buPROPion (WELLBUTRIN XL) 150 MG 24 hr tablet Take 1 tablet (150 mg total) by mouth daily. **PATIENT NEEDS APT FOR FURTHER REFILLS** 30 tablet 0  . NEOMYCIN-POLYMYXIN-HYDROCORTISONE (CORTISPORIN) 1 % SOLN OTIC solution Apply 1-2 drops to toe BID after soaking 10 mL 1  . rosuvastatin (CRESTOR) 40 MG tablet Take 1 tablet (40 mg total) by mouth daily. 90 tablet 1  . venlafaxine XR (EFFEXOR-XR) 150 MG 24 hr capsule Take 1 capsule (150 mg total) by mouth daily with breakfast. **PATIENT NEEDS APT FOR FURTHER REFILLS** 15 capsule 0  . Vitamin D, Ergocalciferol, (DRISDOL) 1.25 MG (50000 UT) CAPS capsule Take 1 capsule (50,000 Units total) by mouth every 7  (seven) days. 16 capsule 0   No current facility-administered medications for this visit.    Allergies as of 02/12/2020  . (No Known Allergies)    Family History  Problem Relation Age of Onset  . Emphysema Mother   . Cancer Father        metastatic  . Cancer Sister        lung  . Emphysema Sister   . Healthy Daughter   . Healthy Son   . Cancer Maternal Grandfather        bladder  . Cancer Sister        melanoma  . COPD Sister   . CAD Brother   . Stroke Brother   . Healthy Brother   . Healthy Brother     Social History   Socioeconomic History  . Marital status: Divorced    Spouse name: Not on file  . Number of children: 2  . Years of education: Not on file  . Highest education level: 12th grade  Occupational History  . Occupation: Retired  Tobacco Use  . Smoking status: Former Smoker    Packs/day: 2.00    Years: 26.00    Pack years: 52.00    Types: Cigarettes    Quit date: 02/22/1985    Years since quitting: 34.9  . Smokeless tobacco: Never Used  Vaping Use  . Vaping Use: Never used  Substance and Sexual Activity  .  Alcohol use: Yes    Comment: rarely  . Drug use: No  . Sexual activity: Not Currently    Birth control/protection: Abstinence  Other Topics Concern  . Not on file  Social History Narrative  . Not on file   Social Determinants of Health   Financial Resource Strain:   . Difficulty of Paying Living Expenses: Not on file  Food Insecurity:   . Worried About Charity fundraiser in the Last Year: Not on file  . Ran Out of Food in the Last Year: Not on file  Transportation Needs:   . Lack of Transportation (Medical): Not on file  . Lack of Transportation (Non-Medical): Not on file  Physical Activity:   . Days of Exercise per Week: Not on file  . Minutes of Exercise per Session: Not on file  Stress:   . Feeling of Stress : Not on file  Social Connections:   . Frequency of Communication with Friends and Family: Not on file  . Frequency of  Social Gatherings with Friends and Family: Not on file  . Attends Religious Services: Not on file  . Active Member of Clubs or Organizations: Not on file  . Attends Archivist Meetings: Not on file  . Marital Status: Not on file  Intimate Partner Violence:   . Fear of Current or Ex-Partner: Not on file  . Emotionally Abused: Not on file  . Physically Abused: Not on file  . Sexually Abused: Not on file    Review of Systems: Gen: Denies any fever, chills, fatigue, weight loss, lack of appetite.  CV: Denies chest pain, heart palpitations, peripheral edema, syncope.  Resp: Denies shortness of breath at rest or with exertion. Denies wheezing or cough.  GI: Denies dysphagia or odynophagia. Denies jaundice, hematemesis, fecal incontinence. GU : Denies urinary burning, urinary frequency, urinary hesitancy MS: Denies joint pain, muscle weakness, cramps, or limitation of movement.  Derm: Denies rash, itching, dry skin Psych: Denies depression, anxiety, memory loss, and confusion Heme: Denies bruising, bleeding, and enlarged lymph nodes.  Physical Exam: There were no vitals taken for this visit. General:   Alert and oriented. Pleasant and cooperative. Well-nourished and well-developed.  Head:  Normocephalic and atraumatic. Eyes:  Without icterus, sclera clear and conjunctiva pink.  Ears:  Normal auditory acuity. Nose:  No deformity, discharge,  or lesions. Mouth:  No deformity or lesions, oral mucosa pink.  Neck:  Supple, without mass or thyromegaly. Lungs:  Clear to auscultation bilaterally. No wheezes, rales, or rhonchi. No distress.  Heart:  S1, S2 present without murmurs appreciated.  Abdomen:  +BS, soft, non-tender and non-distended. No HSM noted. No guarding or rebound. No masses appreciated.  Rectal:  Deferred  Msk:  Symmetrical without gross deformities. Normal posture. Pulses:  Normal pulses noted. Extremities:  Without clubbing or edema. Neurologic:  Alert and   oriented x4;  grossly normal neurologically. Skin:  Intact without significant lesions or rashes. Cervical Nodes:  No significant cervical adenopathy. Psych:  Alert and cooperative. Normal mood and affect.

## 2020-02-12 ENCOUNTER — Ambulatory Visit: Payer: Medicare Other | Admitting: Gastroenterology

## 2020-02-12 ENCOUNTER — Encounter: Payer: Self-pay | Admitting: Internal Medicine

## 2020-03-11 ENCOUNTER — Telehealth: Payer: Self-pay | Admitting: Family Medicine

## 2020-03-11 NOTE — Telephone Encounter (Signed)
12/03/19 ov note faxed to [facility] [phone]

## 2020-12-27 IMAGING — US US ABDOMEN COMPLETE
1 series · 13 of 25 positions shown · non-contrast
Comparison: None.

CLINICAL DATA: Elevated liver enzymes

EXAM:
ABDOMEN ULTRASOUND COMPLETE

[Series 1: us abdomen complete · 13 of 89 slices shown]
[im 1/89]
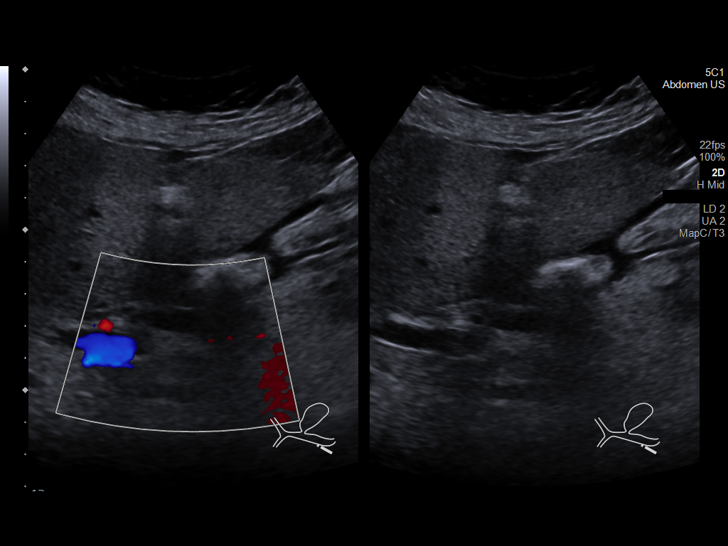
[im 8/89]
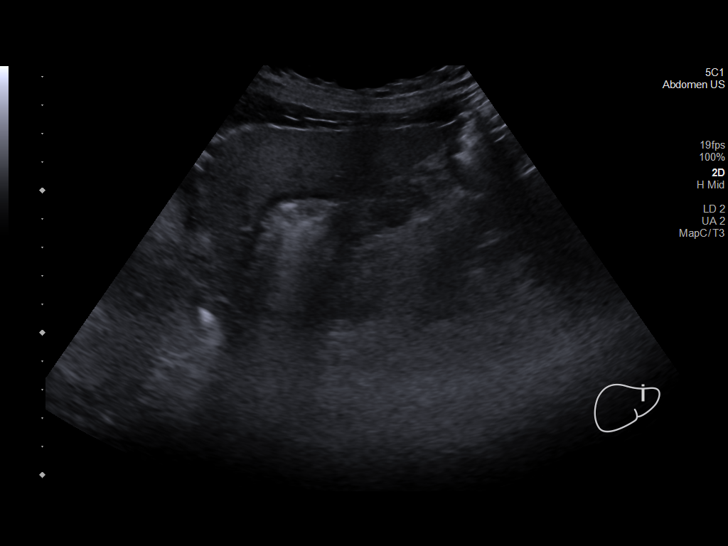
[im 15/89]
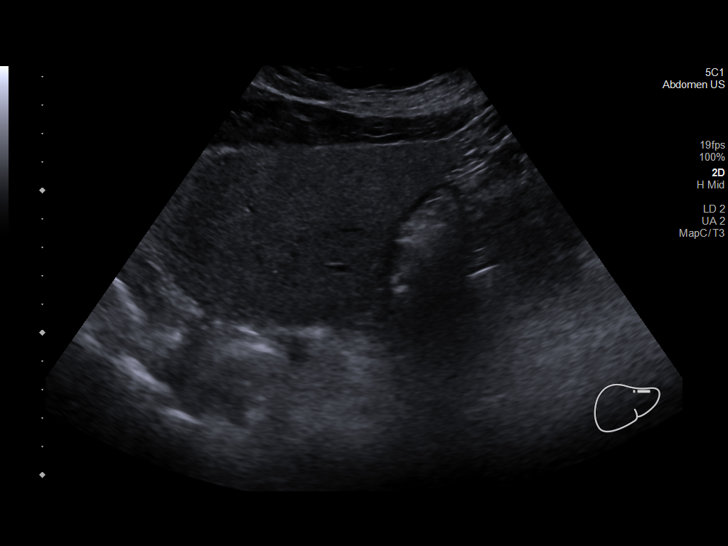
[im 23/89]
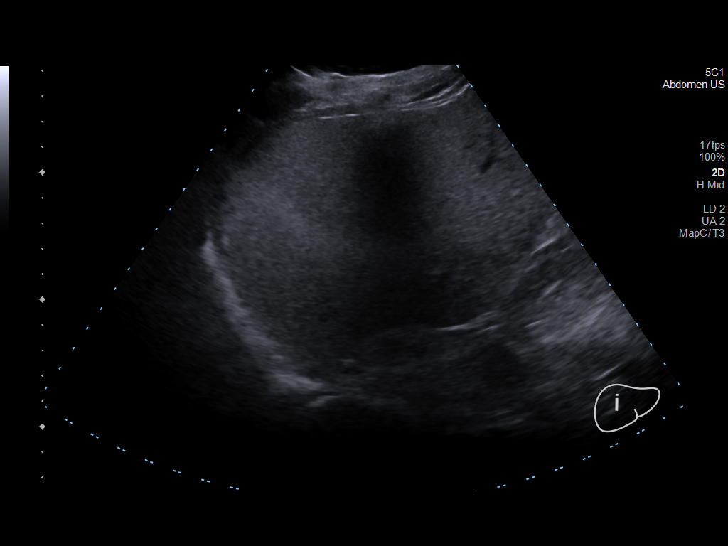
[im 30/89]
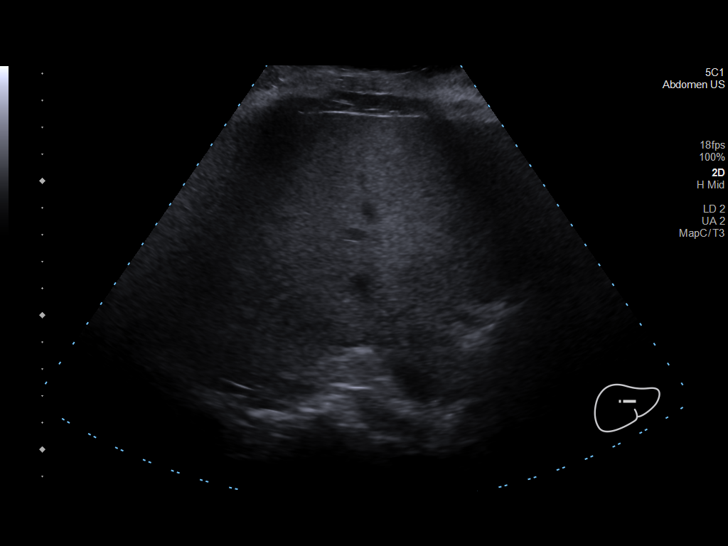
[im 37/89]
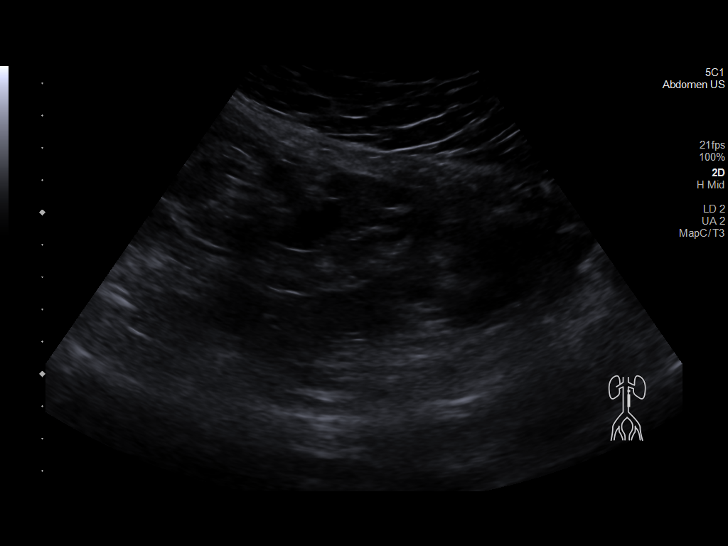
[im 45/89]
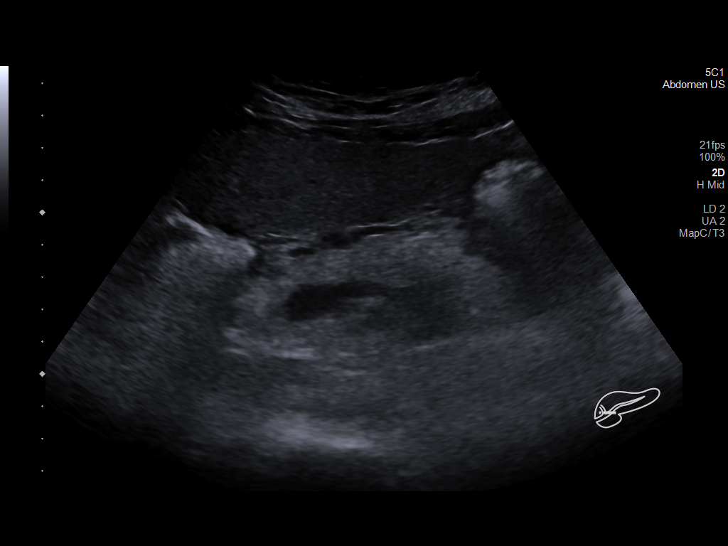
[im 52/89]
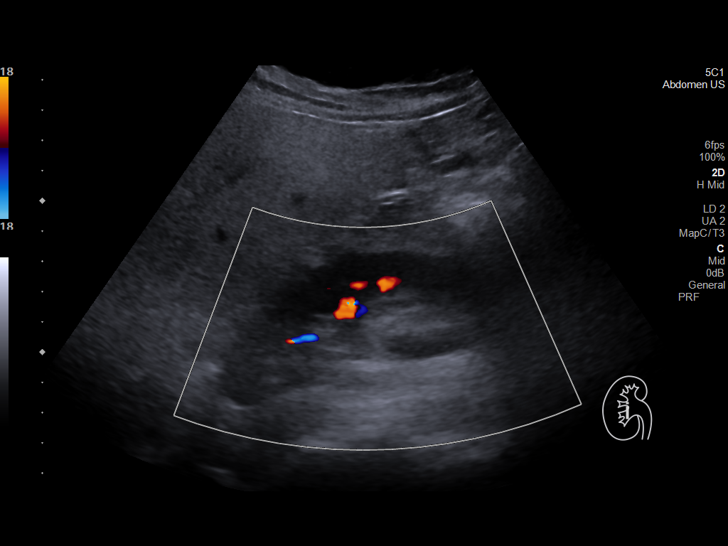
[im 59/89]
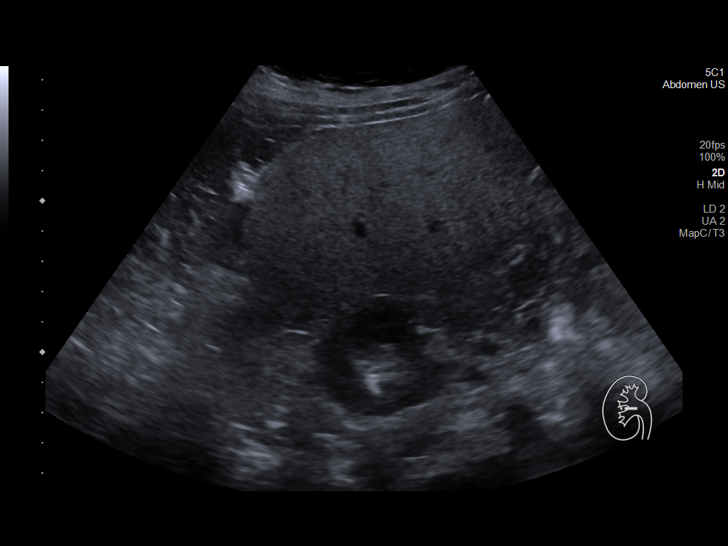
[im 67/89]
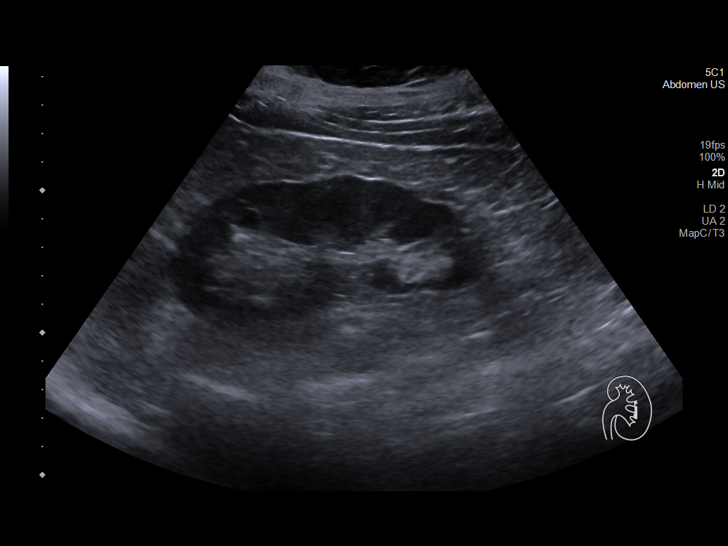
[im 74/89]
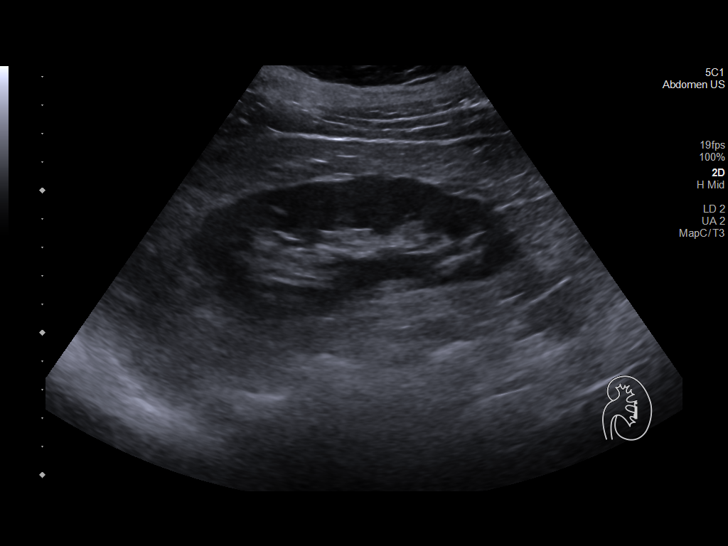
[im 81/89]
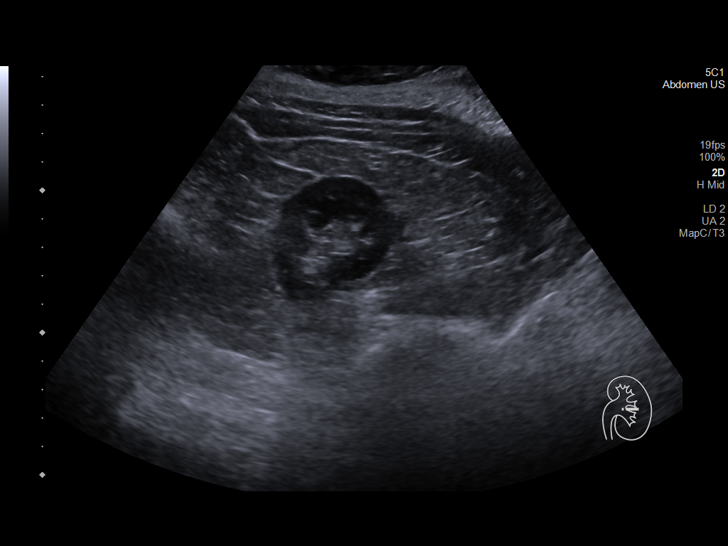
[im 89/89]
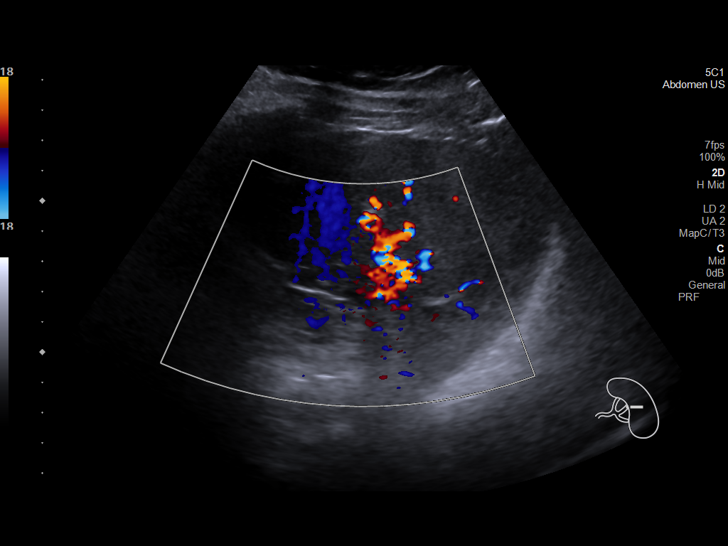

[13 of 25 positions shown; findings below may reference images not displayed]

FINDINGS: Gallbladder: Surgically absent.

Common bile duct: Diameter: 7 mm. No intrahepatic, common hepatic,
or common bile duct dilatation.

Liver: No focal lesion identified. Liver echogenicity is increased
diffusely. Portal vein is patent on color Doppler imaging with
normal direction of blood flow towards the liver.

IVC: No abnormality visualized.

Pancreas: No pancreatic mass or inflammatory focus.

Spleen: Size and appearance within normal limits.

Right Kidney: Length: 10.1 cm. Echogenicity within normal limits. No
mass or hydronephrosis visualized.

Left Kidney: Length: 11.7 cm. Echogenicity within normal limits. No
mass or hydronephrosis visualized.

Abdominal aorta: No aneurysm visualized.

Other findings: No evident ascites.
IMPRESSION: 1. Diffuse increase in liver echogenicity, a finding indicative of
hepatic steatosis. While no focal liver lesions are evident on this
study, it must be cautioned that the sensitivity of ultrasound for
detection of focal liver lesions is diminished in this circumstance.

2.  Gallbladder absent.

3.  Study otherwise unremarkable.
